# Patient Record
Sex: Female | Born: 1945 | Race: Black or African American | Hispanic: No | Marital: Married | Smoking: Current every day smoker
Health system: Southern US, Community
[De-identification: ages and names within clinical notes are randomized; demographics above are authoritative.]

## PROBLEM LIST (undated history)

## (undated) DIAGNOSIS — R7303 Prediabetes: Secondary | ICD-10-CM

## (undated) DIAGNOSIS — I10 Essential (primary) hypertension: Secondary | ICD-10-CM

## (undated) DIAGNOSIS — E119 Type 2 diabetes mellitus without complications: Secondary | ICD-10-CM

## (undated) DIAGNOSIS — K219 Gastro-esophageal reflux disease without esophagitis: Secondary | ICD-10-CM

## (undated) HISTORY — PX: BACK SURGERY: SHX140

## (undated) HISTORY — PX: TUBAL LIGATION: SHX77

---

## 2006-01-07 ENCOUNTER — Ambulatory Visit: Payer: Self-pay | Admitting: Nurse Practitioner

## 2006-02-21 ENCOUNTER — Ambulatory Visit: Payer: Self-pay | Admitting: Nurse Practitioner

## 2006-08-29 ENCOUNTER — Ambulatory Visit (HOSPITAL_COMMUNITY): Admission: RE | Admit: 2006-08-29 | Discharge: 2006-08-29 | Payer: Self-pay | Admitting: Neurosurgery

## 2006-08-29 ENCOUNTER — Ambulatory Visit: Payer: Self-pay | Admitting: Cardiology

## 2006-09-02 ENCOUNTER — Encounter (HOSPITAL_COMMUNITY): Admission: RE | Admit: 2006-09-02 | Discharge: 2006-09-16 | Payer: Self-pay | Admitting: Cardiology

## 2006-09-02 ENCOUNTER — Ambulatory Visit: Payer: Self-pay | Admitting: Cardiology

## 2006-09-02 ENCOUNTER — Ambulatory Visit (HOSPITAL_COMMUNITY): Admission: RE | Admit: 2006-09-02 | Discharge: 2006-09-02 | Payer: Self-pay | Admitting: Cardiology

## 2006-09-04 ENCOUNTER — Ambulatory Visit: Payer: Self-pay | Admitting: Cardiology

## 2006-09-23 ENCOUNTER — Ambulatory Visit: Payer: Self-pay | Admitting: Cardiology

## 2006-09-30 ENCOUNTER — Ambulatory Visit (HOSPITAL_COMMUNITY): Admission: RE | Admit: 2006-09-30 | Discharge: 2006-09-30 | Payer: Self-pay | Admitting: Neurosurgery

## 2006-10-24 ENCOUNTER — Ambulatory Visit (HOSPITAL_COMMUNITY): Admission: RE | Admit: 2006-10-24 | Discharge: 2006-10-25 | Payer: Self-pay | Admitting: Neurosurgery

## 2007-07-16 ENCOUNTER — Ambulatory Visit: Payer: Self-pay | Admitting: Family Medicine

## 2010-05-01 ENCOUNTER — Ambulatory Visit: Payer: Self-pay | Admitting: Gastroenterology

## 2015-07-11 ENCOUNTER — Ambulatory Visit
Admission: RE | Admit: 2015-07-11 | Discharge: 2015-07-11 | Disposition: A | Discharge status: Home / Self Care | Payer: Medicare Other | Source: Ambulatory Visit | Attending: Family Medicine | Admitting: Family Medicine

## 2015-07-11 ENCOUNTER — Other Ambulatory Visit: Payer: Self-pay | Admitting: Family Medicine

## 2015-07-11 DIAGNOSIS — M25562 Pain in left knee: Secondary | ICD-10-CM

## 2015-12-19 ENCOUNTER — Encounter
Admission: RE | Admit: 2015-12-19 | Discharge: 2015-12-19 | Disposition: A | Discharge status: Home / Self Care | Payer: Medicare Other | Source: Ambulatory Visit | Attending: Obstetrics and Gynecology | Admitting: Obstetrics and Gynecology

## 2015-12-19 DIAGNOSIS — I10 Essential (primary) hypertension: Secondary | ICD-10-CM | POA: Insufficient documentation

## 2015-12-19 DIAGNOSIS — Z01810 Encounter for preprocedural cardiovascular examination: Secondary | ICD-10-CM | POA: Insufficient documentation

## 2015-12-19 DIAGNOSIS — Z01812 Encounter for preprocedural laboratory examination: Secondary | ICD-10-CM | POA: Insufficient documentation

## 2015-12-19 HISTORY — DX: Essential (primary) hypertension: I10

## 2015-12-19 HISTORY — DX: Type 2 diabetes mellitus without complications: E11.9

## 2015-12-19 LAB — TYPE AND SCREEN
ABO/RH(D): O POS
Antibody Screen: NEGATIVE

## 2015-12-19 LAB — COMPREHENSIVE METABOLIC PANEL
ALBUMIN: 4.5 g/dL (ref 3.5–5.0)
ALK PHOS: 61 U/L (ref 38–126)
ALT: 11 U/L — AB (ref 14–54)
ANION GAP: 10 (ref 5–15)
AST: 17 U/L (ref 15–41)
BUN: 22 mg/dL — ABNORMAL HIGH (ref 6–20)
CALCIUM: 9.9 mg/dL (ref 8.9–10.3)
CO2: 24 mmol/L (ref 22–32)
CREATININE: 1.04 mg/dL — AB (ref 0.44–1.00)
Chloride: 98 mmol/L — ABNORMAL LOW (ref 101–111)
GFR calc Af Amer: >60 mL/min (ref >60)
GFR calc non Af Amer: 54 mL/min — ABNORMAL LOW (ref >60)
GLUCOSE: 146 mg/dL — AB (ref 65–99)
Potassium: 3.4 mmol/L — ABNORMAL LOW (ref 3.5–5.1)
SODIUM: 132 mmol/L — AB (ref 135–145)
Total Bilirubin: 0.7 mg/dL (ref 0.3–1.2)
Total Protein: 7.7 g/dL (ref 6.5–8.1)

## 2015-12-19 LAB — ABO/RH: ABO/RH(D): O POS

## 2015-12-19 LAB — CBC
HEMATOCRIT: 39 % (ref 35.0–47.0)
HEMOGLOBIN: 13.4 g/dL (ref 12.0–16.0)
MCH: 28.9 pg (ref 26.0–34.0)
MCHC: 34.4 g/dL (ref 32.0–36.0)
MCV: 84.2 fL (ref 80.0–100.0)
Platelets: 305 10*3/uL (ref 150–440)
RBC: 4.63 MIL/uL (ref 3.80–5.20)
RDW: 12.8 % (ref 11.5–14.5)
WBC: 8 10*3/uL (ref 3.6–11.0)

## 2015-12-19 NOTE — Patient Instructions (Signed)
  Your procedure is scheduled on: 12/27/15 Report to Day Surgery.[facility]OND [facility]LOOR To find out your arrival time please call [phone] between 1PM - 3PM on 12/26/15.  Remember: Instructions that are not followed completely may result in serious medical risk, up to and including death, or upon the discretion of your surgeon and anesthesiologist your surgery may need to be rescheduled.    _X___ 1. Do not eat food or drink liquids after midnight. No gum chewing or hard candies.     __X__ 2. No Alcohol for 24 hours before or after surgery.   ____ 3. Bring all medications with you on the day of surgery if instructed.    __X__ 4. Notify your doctor if there is any change in your medical condition     (cold, fever, infections).     Do not wear jewelry, make-up, hairpins, clips or nail polish.  Do not wear lotions, powders, or perfumes. You may wear deodorant.  Do not shave 48 hours prior to surgery. Men may shave face and neck.  Do not bring valuables to the hospital.    [facility] is [facility] any belongings or valuables.               Contacts, dentures or bridgework may not be worn into surgery.  Leave your suitcase in the car. After surgery it may be brought to your room.  For patients admitted to the hospital, discharge time is determined by your                treatment team.   Patients discharged the day of surgery will not be allowed to drive home.   Please read over the following fact sheets that you were given:   Surgical Site Infection Prevention   ____ Take these medicines the morning of surgery with A SIP OF WATER:    1. AMLODIPINE  2. TRILIPIX  3. LOVASTATIN  4.  5.  6.  ____ Fleet Enema (as directed)   ____ Use CHG Soap as directed  ____ Use inhalers on the day of surgery  ____ Stop metformin 2 days prior to surgery    ____ Take 1/2 of usual insulin dose the night before surgery and none on the morning of surgery.   _X Stop  Coumadin/Plavix/aspirin on  STOP ASPIRIN NOW __X__ Stop Anti-inflammatories on    STOP IBUPROFEN   ____ Stop supplements until after surgery.    ____ Bring C-Pap to the hospital.

## 2015-12-27 ENCOUNTER — Ambulatory Visit
Admission: RE | Admit: 2015-12-27 | Discharge: 2015-12-27 | Disposition: A | Discharge status: Home / Self Care | Payer: Medicare Other | Source: Ambulatory Visit | Attending: Obstetrics and Gynecology | Admitting: Obstetrics and Gynecology

## 2015-12-27 ENCOUNTER — Ambulatory Visit: Payer: Medicare Other | Admitting: Anesthesiology

## 2015-12-27 ENCOUNTER — Encounter: Payer: Self-pay | Admitting: *Deleted

## 2015-12-27 ENCOUNTER — Encounter
Admission: RE | Disposition: A | Discharge status: Home / Self Care | Payer: Self-pay | Source: Ambulatory Visit | Attending: Obstetrics and Gynecology

## 2015-12-27 DIAGNOSIS — E7800 Pure hypercholesterolemia, unspecified: Secondary | ICD-10-CM | POA: Diagnosis not present

## 2015-12-27 DIAGNOSIS — Z888 Allergy status to other drugs, medicaments and biological substances status: Secondary | ICD-10-CM | POA: Insufficient documentation

## 2015-12-27 DIAGNOSIS — N941 Unspecified dyspareunia: Secondary | ICD-10-CM | POA: Diagnosis not present

## 2015-12-27 DIAGNOSIS — N840 Polyp of corpus uteri: Secondary | ICD-10-CM | POA: Insufficient documentation

## 2015-12-27 DIAGNOSIS — I10 Essential (primary) hypertension: Secondary | ICD-10-CM | POA: Insufficient documentation

## 2015-12-27 DIAGNOSIS — F17200 Nicotine dependence, unspecified, uncomplicated: Secondary | ICD-10-CM | POA: Insufficient documentation

## 2015-12-27 DIAGNOSIS — R7303 Prediabetes: Secondary | ICD-10-CM | POA: Insufficient documentation

## 2015-12-27 DIAGNOSIS — Z7982 Long term (current) use of aspirin: Secondary | ICD-10-CM | POA: Insufficient documentation

## 2015-12-27 DIAGNOSIS — Z79899 Other long term (current) drug therapy: Secondary | ICD-10-CM | POA: Diagnosis not present

## 2015-12-27 HISTORY — PX: DILATATION & CURETTAGE/HYSTEROSCOPY WITH MYOSURE: SHX6511

## 2015-12-27 LAB — GLUCOSE, CAPILLARY
GLUCOSE-CAPILLARY: 109 mg/dL — AB (ref 65–99)
Glucose-Capillary: 164 mg/dL — ABNORMAL HIGH (ref 65–99)

## 2015-12-27 SURGERY — DILATATION & CURETTAGE/HYSTEROSCOPY WITH MYOSURE
Anesthesia: General | Wound class: Clean Contaminated

## 2015-12-27 MED ORDER — HYDROCODONE-ACETAMINOPHEN 5-325 MG PO TABS: 1.0000 | ORAL_TABLET | Freq: Four times a day (QID) | ORAL | Status: DC | PRN

## 2015-12-27 MED ORDER — SILVER NITRATE-POT NITRATE 75-25 % EX MISC
CUTANEOUS | Status: AC
  Filled 2015-12-27: qty 2

## 2015-12-27 MED ORDER — FENTANYL CITRATE (PF) 100 MCG/2ML IJ SOLN: 25.0000 ug | INTRAMUSCULAR | Status: DC | PRN

## 2015-12-27 MED ORDER — ONDANSETRON HCL 4 MG/2ML IJ SOLN
INTRAMUSCULAR | Status: DC | PRN
  Administered 2015-12-27: 4 mg via INTRAVENOUS

## 2015-12-27 MED ORDER — LIDOCAINE HCL (CARDIAC) 20 MG/ML IV SOLN
INTRAVENOUS | Status: DC | PRN
  Administered 2015-12-27: 100 mg via INTRAVENOUS

## 2015-12-27 MED ORDER — FAMOTIDINE 20 MG PO TABS
ORAL_TABLET | ORAL | Status: AC
  Administered 2015-12-27: 20 mg via ORAL
  Filled 2015-12-27: qty 1

## 2015-12-27 MED ORDER — LACTATED RINGERS IV SOLN
INTRAVENOUS | Status: DC
  Administered 2015-12-27: 14:00:00 via INTRAVENOUS

## 2015-12-27 MED ORDER — IBUPROFEN 600 MG PO TABS: 600.0000 mg | ORAL_TABLET | Freq: Four times a day (QID) | ORAL | Status: DC | PRN

## 2015-12-27 MED ORDER — PROPOFOL 10 MG/ML IV BOLUS
INTRAVENOUS | Status: DC | PRN
  Administered 2015-12-27: 130 mg via INTRAVENOUS

## 2015-12-27 MED ORDER — FENTANYL CITRATE (PF) 100 MCG/2ML IJ SOLN
INTRAMUSCULAR | Status: DC | PRN
  Administered 2015-12-27: 100 ug via INTRAVENOUS

## 2015-12-27 MED ORDER — SODIUM CHLORIDE 0.9 % IV SOLN
INTRAVENOUS | Status: DC
  Administered 2015-12-27: 12:00:00 via INTRAVENOUS

## 2015-12-27 MED ORDER — ONDANSETRON HCL 4 MG/2ML IJ SOLN: 4.0000 mg | Freq: Once | INTRAMUSCULAR | Status: DC | PRN

## 2015-12-27 MED ORDER — FAMOTIDINE 20 MG PO TABS
20.0000 mg | ORAL_TABLET | Freq: Once | ORAL | Status: AC
  Administered 2015-12-27: 20 mg via ORAL

## 2015-12-27 SURGICAL SUPPLY — 20 items
ABLATOR ENDOMETRIAL MYOSURE (ABLATOR) ×1 IMPLANT
CANISTER SUC SOCK COL 7IN (MISCELLANEOUS) ×3 IMPLANT
CATH ROBINSON RED A/P 16FR (CATHETERS) ×3 IMPLANT
ELECT REM PT RETURN 9FT ADLT (ELECTROSURGICAL) ×3
ELECTRODE REM PT RTRN 9FT ADLT (ELECTROSURGICAL) ×1 IMPLANT
GLOVE BIO SURGEON STRL SZ7 (GLOVE) ×12 IMPLANT
GLOVE BIOGEL PI IND STRL 7.5 (GLOVE) ×4 IMPLANT
GLOVE BIOGEL PI INDICATOR 7.5 (GLOVE) ×8
GOWN STRL REUS W/ TWL LRG LVL3 (GOWN DISPOSABLE) ×1 IMPLANT
GOWN STRL REUS W/TWL LRG LVL3 (GOWN DISPOSABLE) ×3
IV LACTATED RINGER IRRG 3000ML (IV SOLUTION) ×3
IV LR IRRIG 3000ML ARTHROMATIC (IV SOLUTION) ×1 IMPLANT
KIT RM TURNOVER CYSTO AR (KITS) ×3 IMPLANT
MYOSURE LITE POLYP REMOVAL (MISCELLANEOUS) IMPLANT
PACK DNC HYST (MISCELLANEOUS) ×3 IMPLANT
PAD OB MATERNITY 4.3X12.25 (PERSONAL CARE ITEMS) ×3 IMPLANT
PAD PREP 24X41 OB/GYN DISP (PERSONAL CARE ITEMS) ×3 IMPLANT
TUBING CONNECTING 10 (TUBING) ×2 IMPLANT
TUBING CONNECTING 10' (TUBING) ×1
TUBING HYSTEROSCOPY DOLPHIN (MISCELLANEOUS) ×3 IMPLANT

## 2015-12-27 NOTE — Transfer of Care (Signed)
Immediate Anesthesia Transfer of Care Note  Patient: Gail Carroll  Procedure(s) Performed: Procedure(s): DILATATION & CURETTAGE/HYSTEROSCOPY WITH MYOSURE/POLYPECTOMY (N/A)  Patient Location: PACU  Anesthesia Type:General  Level of Consciousness: awake, alert  and oriented  Airway & Oxygen Therapy: Patient Spontanous Breathing  Post-op Assessment: Report given to RN and Post -op Vital signs reviewed and stable  Post vital signs: Reviewed and stable  Last Vitals:  Filed Vitals:   12/27/15 1114  BP: 120/73  Pulse: 86  Temp: 36.7 C  Resp: 16    Complications: No apparent anesthesia complications

## 2015-12-27 NOTE — Anesthesia Procedure Notes (Signed)
Procedure Name: LMA Insertion Date/Time: 12/27/2015 2:16 PM Performed by: Almeta MonasFLETCHER, Birdena Kingma Pre-anesthesia Checklist: Patient identified, Emergency Drugs available, Suction available and Patient being monitored Patient Re-evaluated:Patient Re-evaluated prior to inductionOxygen Delivery Method: Circle system utilized Preoxygenation: Pre-oxygenation with 100% oxygen Intubation Type: IV induction LMA: LMA inserted LMA Size: 3.5 Number of attempts: 1 Tube secured with: Tape Dental Injury: Teeth and Oropharynx as per pre-operative assessment

## 2015-12-27 NOTE — Discharge Instructions (Signed)
AMBULATORY SURGERY  °DISCHARGE INSTRUCTIONS ° ° °1) The drugs that you were given will stay in your system until tomorrow so for the next 24 hours you should not: ° °A) Drive an automobile °B) Make any legal decisions °C) Drink any alcoholic beverage ° ° °2) You may resume regular meals tomorrow.  Today it is better to start with liquids and gradually work up to solid foods. ° °You may eat anything you prefer, but it is better to start with liquids, then soup and crackers, and gradually work up to solid foods. ° ° °3) Please notify your doctor immediately if you have any unusual bleeding, trouble breathing, redness and pain at the surgery site, drainage, fever, or pain not relieved by medication. ° ° ° °4) Additional Instructions: ° ° ° ° ° ° ° °Please contact your physician with any problems or Same Day Surgery at [phone], Monday through Friday 6 am to 4 pm, or [place] [facility] [place] Main number at [phone]. °

## 2015-12-27 NOTE — Anesthesia Preprocedure Evaluation (Addendum)
Anesthesia Evaluation  Patient identified by MRN, date of birth, ID band Patient awake    Reviewed: Allergy & Precautions, NPO status , Patient's Chart, lab work & pertinent test results, reviewed documented beta blocker date and time   Airway Mallampati: II  TM Distance: >3 FB     Dental  (+) Upper Dentures, Lower Dentures   Pulmonary Current Smoker,           Cardiovascular hypertension, Pt. on medications      Neuro/Psych    GI/Hepatic   Endo/Other  diabetes, Type 2  Renal/GU      Musculoskeletal   Abdominal   Peds  Hematology   Anesthesia Other Findings EKG OK.  Reproductive/Obstetrics                            Anesthesia Physical Anesthesia Plan  ASA: II  Anesthesia Plan: General   Post-op Pain Management:    Induction: Intravenous  Airway Management Planned: LMA  Additional Equipment:   Intra-op Plan:   Post-operative Plan:   Informed Consent: I have reviewed the patients History and Physical, chart, labs and discussed the procedure including the risks, benefits and alternatives for the proposed anesthesia with the patient or authorized representative who has indicated his/her understanding and acceptance.     Plan Discussed with: CRNA  Anesthesia Plan Comments:         Anesthesia Quick Evaluation

## 2015-12-27 NOTE — Anesthesia Postprocedure Evaluation (Signed)
Anesthesia Post Note  Patient: Gail Carroll  Procedure(s) Performed: Procedure(s) (LRB): DILATATION & CURETTAGE/HYSTEROSCOPY WITH MYOSURE/POLYPECTOMY (N/A)  Patient location during evaluation: PACU Anesthesia Type: General Level of consciousness: awake and alert Pain management: pain level controlled Vital Signs Assessment: post-procedure vital signs reviewed and stable Respiratory status: spontaneous breathing, nonlabored ventilation, respiratory function stable and patient connected to nasal cannula oxygen Cardiovascular status: blood pressure returned to baseline and stable Postop Assessment: no backache Anesthetic complications: no    Last Vitals:  Filed Vitals:   12/27/15 1536 12/27/15 1542  BP: 129/76 140/71  Pulse: 75 77  Temp:  36.3 C  Resp: 13 11    Last Pain:  Filed Vitals:   12/27/15 1544  PainSc: 2                  Roslynn AmbleElena O Montavious Wierzba

## 2015-12-27 NOTE — Op Note (Signed)
Operative Note   12/27/2015  PRE-OP DIAGNOSIS: endometrial polyp   POST-OP DIAGNOSIS: endometrial polyp   SURGEON: Surgeon(s) and Role:    * Conard NovakStephen D Janeen Watson, MD - Primary  PROCEDURE: Procedure(s): DILATATION & CURETTAGE/HYSTEROSCOPY WITH MYOSURE/POLYPECTOMY   ANESTHESIA: General LMA  ESTIMATED BLOOD LOSS: 10 mL  DRAINS: None   TOTAL IV FLUIDS: 1,000 mL crystalloid  SPECIMENS:  endometrial polyp and endometrial curettings  VTE PROPHYLAXIS: SCDs to the bilateral lower extremities  ANTIBIOTICS: None indicated  FLUID DEFICIT: 30 mL  COMPLICATIONS: None  DISPOSITION: PACU - hemodynamically stable.  CONDITION: stable  FINDINGS: Exam under anesthesia revealed small, mobile 6 week uterus with no masses and bilateral adnexa without masses or fullness. Hysteroscopy revealed a uterine cavity a broad-based posterior wall polypoid lesion and a small (<1cm) left anterior lesion and bilateral tubal ostia and normal appearing endocervical canal.  PROCEDURE IN DETAIL:  After informed consent was obtained, the patient was taken to the operating room where anesthesia was obtained without difficulty. The patient was positioned in the dorsal lithotomy position in candy cane stirrups.  The patient's bladder was catheterized with an in and out foley catheter.  The patient was examined under anesthesia, with the above noted findings.  The bi-valved speculum was placed inside the patient's vagina, and the the anterior lip of the cervix was seen and grasped with the tenaculum.  The cervix was progressively dilated to 6mm Hegar dilator.  The hysteroscope was introduced, with the above noted findings. The myosure light hysteroscope was introduced and the above-noted polyps were removed without difficulty.    The hystersocope was removed and the uterine cavity was curetted yielding scant endometrial curettings.  The hysteroscope was introduced again and excellent hemostasis was noted even with fluid  pressure well below the MAP, and all instruments were removed, with excellent hemostasis noted throughout.  She was then taken out of dorsal lithotomy.  The patient tolerated the procedure well.  Sponge, lap and needle counts were correct x2.  The patient was taken to recovery room in excellent condition.  Conard NovakStephen D. Dick Hark, MD, FACOG 12/27/2015 2:53 PM

## 2015-12-27 NOTE — H&P (Signed)
History and Physical Interval Note:  Gail Carroll  has presented today for surgery, with the diagnosis of ENDOMETRIAL POLYPS  The various methods of treatment have been discussed with the patient and family. After consideration of risks, benefits and other options for treatment, the patient has consented to  Procedure(s): DILATATION & CURETTAGE/HYSTEROSCOPY WITH MYOSURE (N/A) as a surgical intervention .  The patient's history has been reviewed, patient examined, no change in status, stable for surgery.  I have reviewed the patient's chart and labs.  Questions were answered to the patient's satisfaction.    Conard NovakJackson, Keiaira Donlan D, MD 12/27/2015 1:51 PM

## 2015-12-28 ENCOUNTER — Encounter: Payer: Self-pay | Admitting: Obstetrics and Gynecology

## 2015-12-29 LAB — SURGICAL PATHOLOGY

## 2018-05-01 ENCOUNTER — Ambulatory Visit
Admission: RE | Admit: 2018-05-01 | Discharge: 2018-05-01 | Disposition: A | Discharge status: Home / Self Care | Payer: PRIVATE HEALTH INSURANCE | Source: Ambulatory Visit | Attending: Physician Assistant | Admitting: Physician Assistant

## 2018-05-01 ENCOUNTER — Other Ambulatory Visit: Payer: Self-pay | Admitting: Physician Assistant

## 2018-05-01 DIAGNOSIS — R0781 Pleurodynia: Secondary | ICD-10-CM | POA: Diagnosis present

## 2018-05-01 DIAGNOSIS — J9811 Atelectasis: Secondary | ICD-10-CM | POA: Insufficient documentation

## 2018-05-01 DIAGNOSIS — R52 Pain, unspecified: Secondary | ICD-10-CM

## 2019-12-21 IMAGING — CR DG RIBS W/ CHEST 3+V*R*
4 series · 4 of 4 positions shown · non-contrast
Comparison: Chest x-ray 07/16/2007.

CLINICAL DATA: Back injury.

EXAM:
RIGHT RIBS AND CHEST - 3+ VIEW

[chest pa]
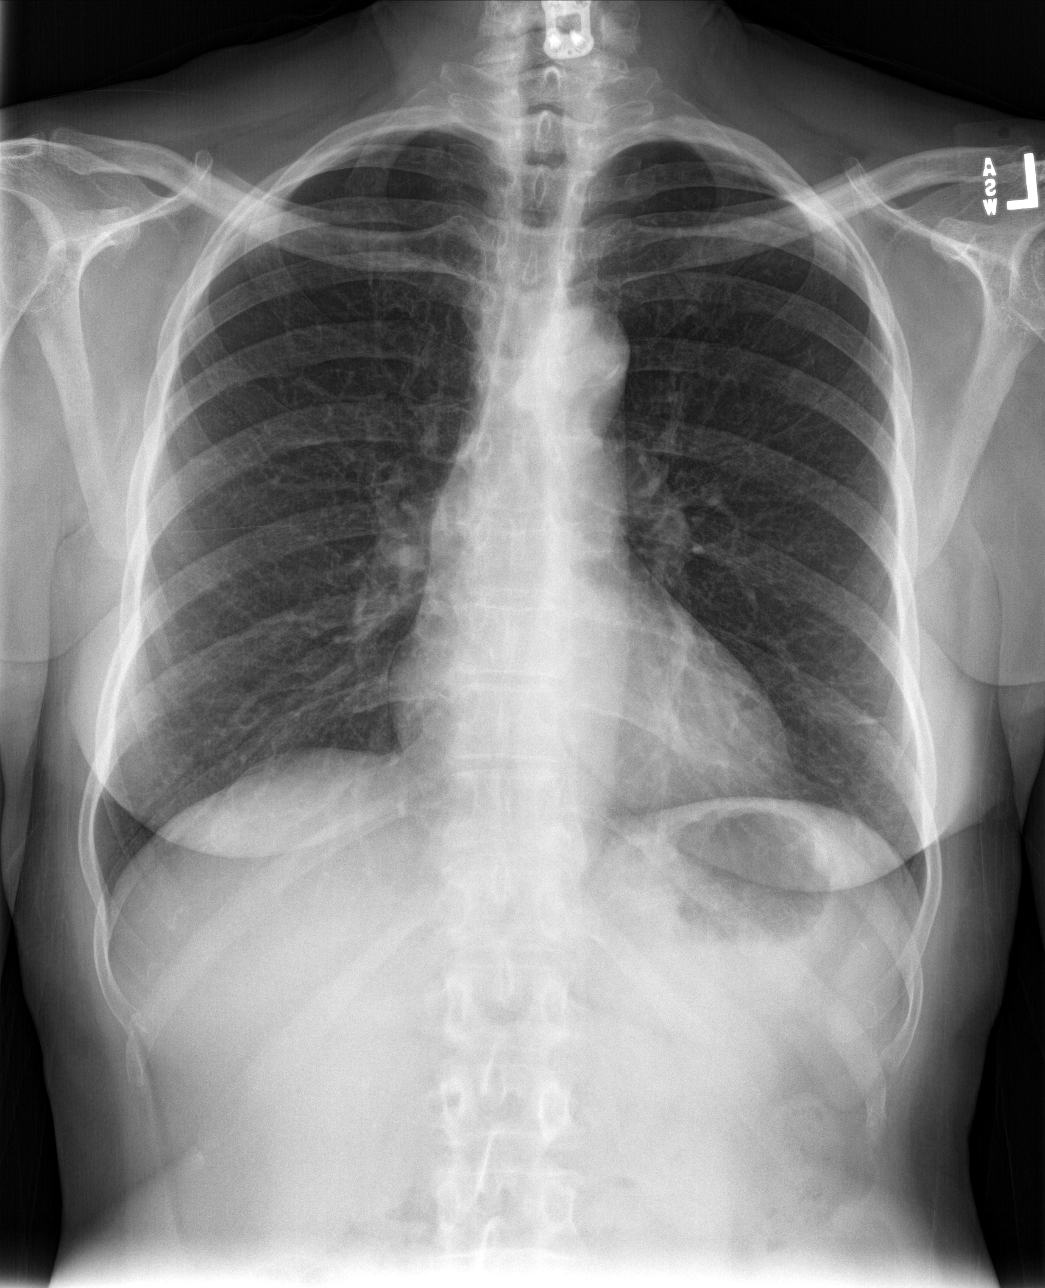

[rib pa]
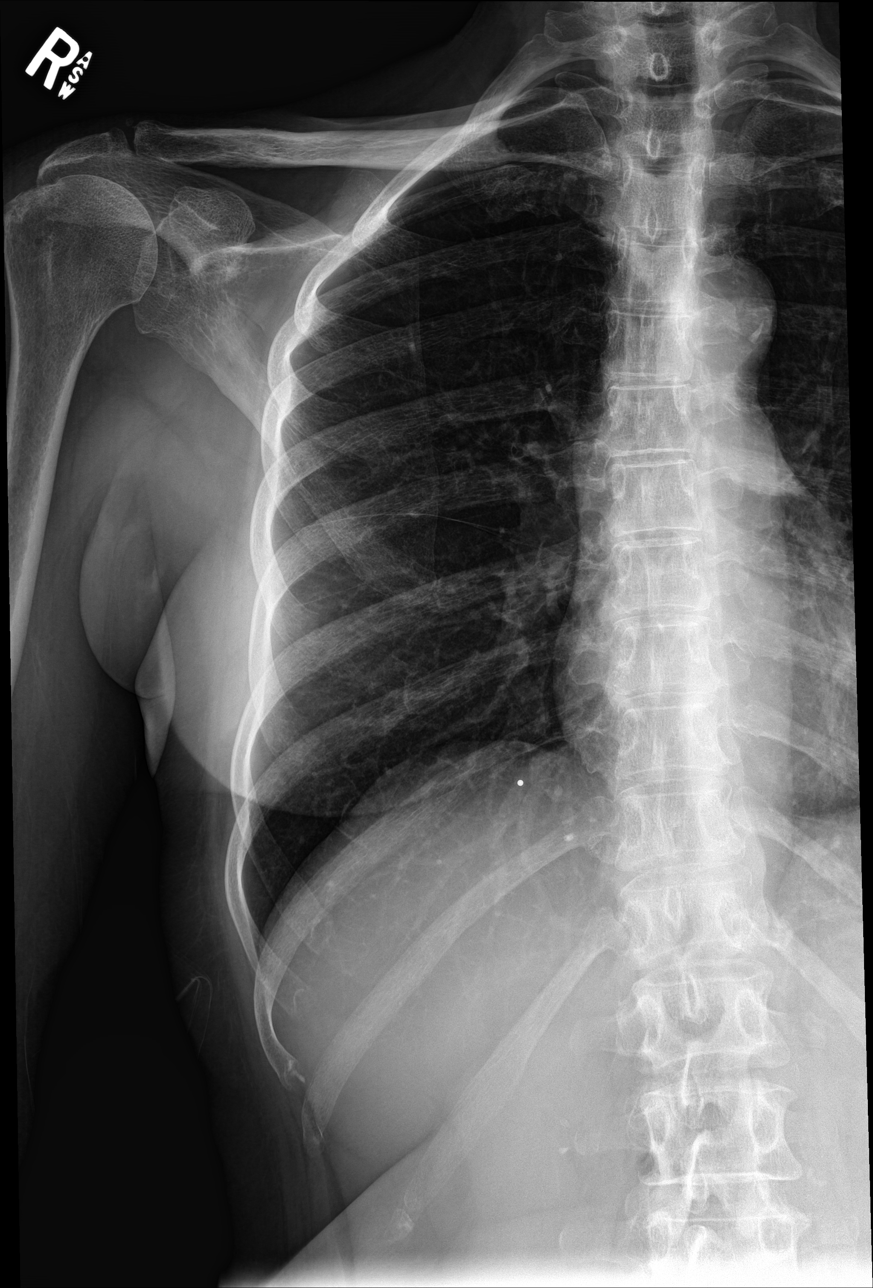

[rib obl (1 of 2)]
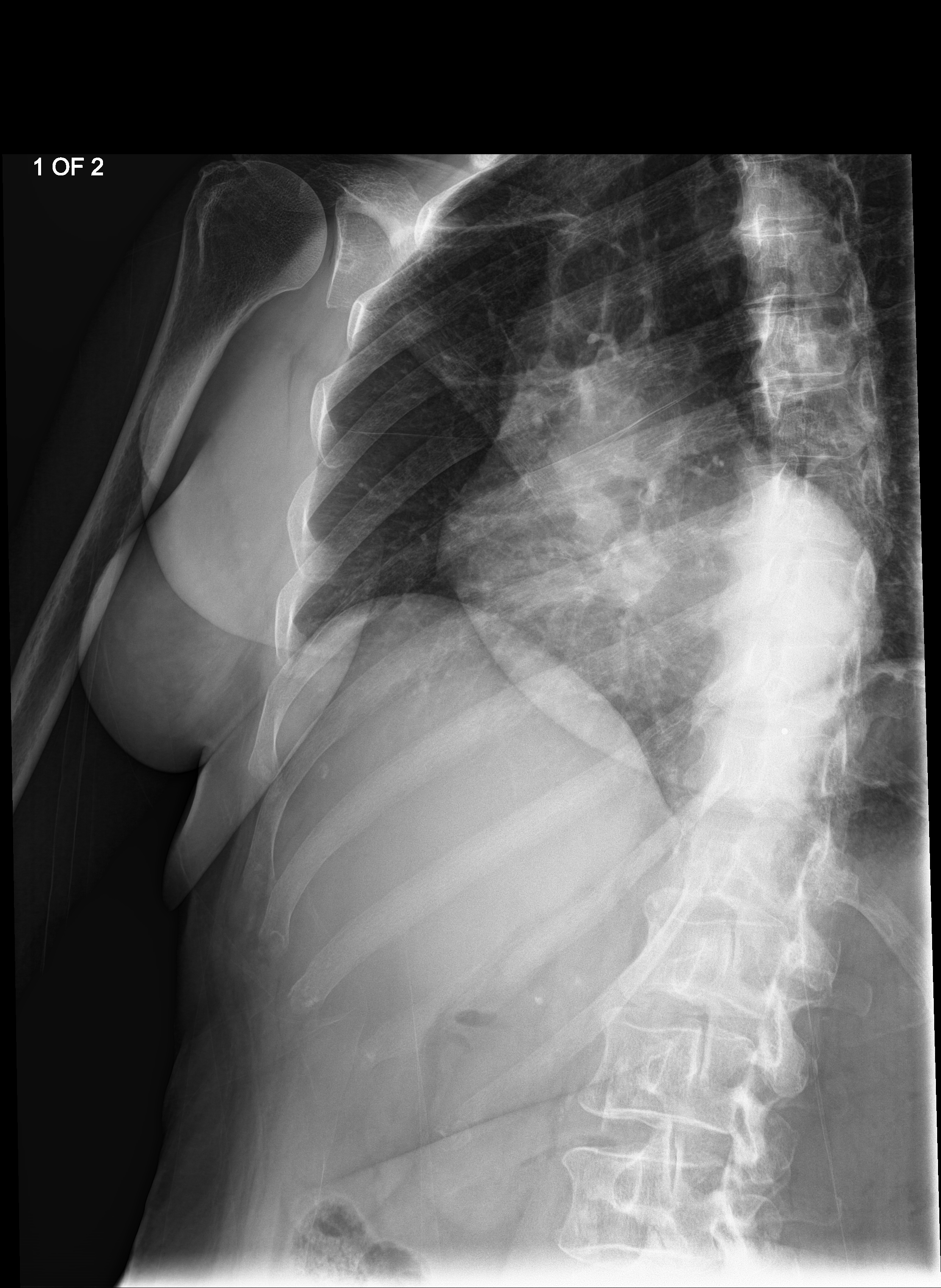

[rib obl (2 of 2)]
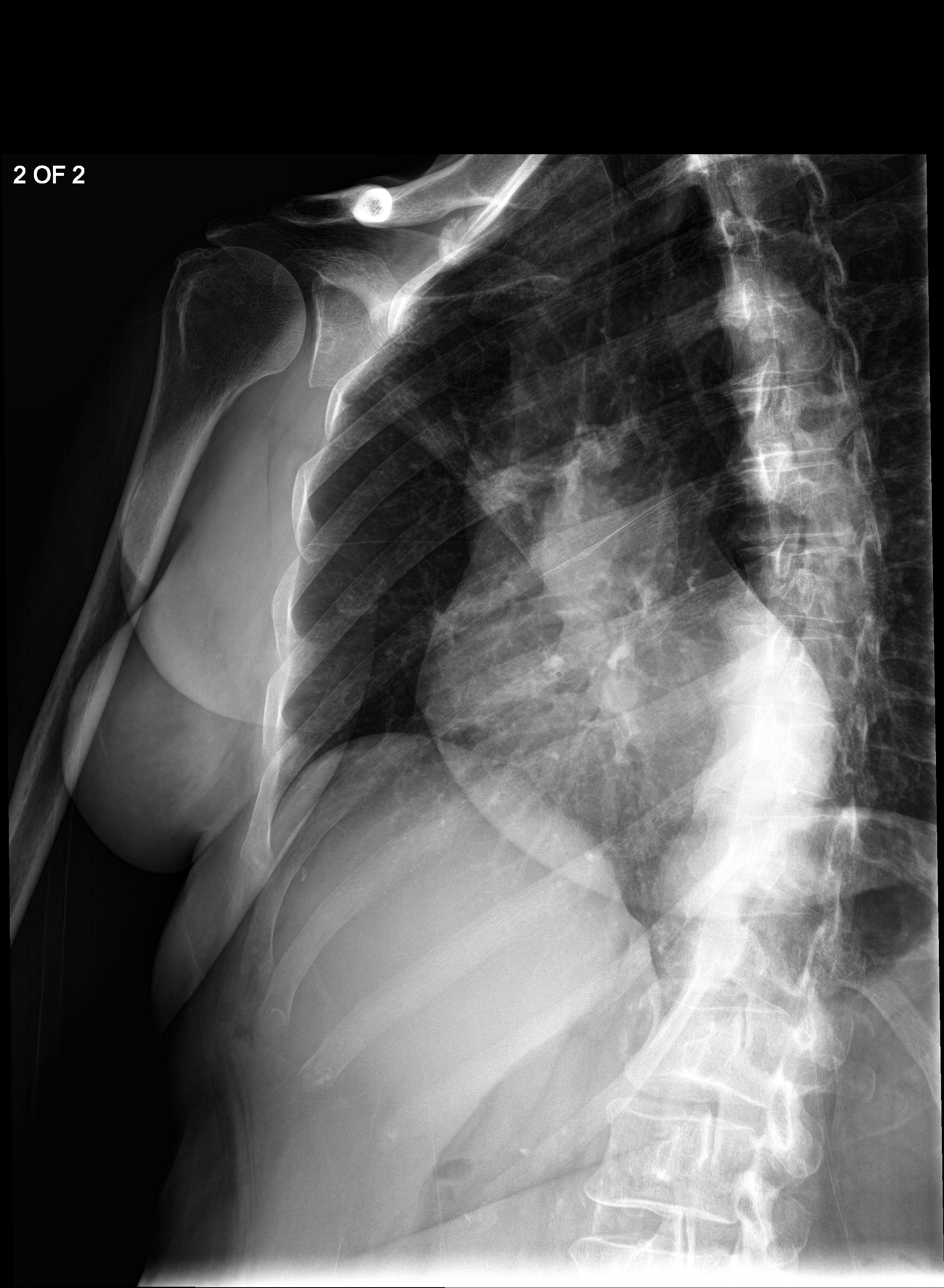

[4 of 4 positions shown; findings below may reference images not displayed]

FINDINGS: Mediastinum and hilar structures normal. Mild left base subsegmental
atelectasis. No focal infiltrate. No pleural effusion or
pneumothorax. Heart size normal. Degenerative change thoracic spine.
Prior cervical spine fusion. No evidence of displaced rib fracture.
IMPRESSION: Number mild left base subsegmental atelectasis.

2. No acute bony abnormality. No evidence of displaced rib fracture
or pneumothorax.

## 2022-03-01 ENCOUNTER — Encounter: Payer: Self-pay | Admitting: Gastroenterology

## 2022-03-02 ENCOUNTER — Encounter: Payer: Self-pay | Admitting: Gastroenterology

## 2022-03-02 ENCOUNTER — Ambulatory Visit: Payer: Medicare HMO | Admitting: Certified Registered"

## 2022-03-02 ENCOUNTER — Ambulatory Visit
Admission: RE | Admit: 2022-03-02 | Discharge: 2022-03-02 | Disposition: A | Discharge status: Home / Self Care | Payer: Medicare HMO | Attending: Gastroenterology | Admitting: Gastroenterology

## 2022-03-02 ENCOUNTER — Encounter
Admission: RE | Disposition: A | Discharge status: Home / Self Care | Payer: Self-pay | Source: Home / Self Care | Attending: Gastroenterology

## 2022-03-02 DIAGNOSIS — E119 Type 2 diabetes mellitus without complications: Secondary | ICD-10-CM | POA: Diagnosis not present

## 2022-03-02 DIAGNOSIS — K6389 Other specified diseases of intestine: Secondary | ICD-10-CM | POA: Diagnosis not present

## 2022-03-02 DIAGNOSIS — I10 Essential (primary) hypertension: Secondary | ICD-10-CM | POA: Insufficient documentation

## 2022-03-02 DIAGNOSIS — K5730 Diverticulosis of large intestine without perforation or abscess without bleeding: Secondary | ICD-10-CM | POA: Diagnosis not present

## 2022-03-02 DIAGNOSIS — F17200 Nicotine dependence, unspecified, uncomplicated: Secondary | ICD-10-CM | POA: Diagnosis not present

## 2022-03-02 DIAGNOSIS — K635 Polyp of colon: Secondary | ICD-10-CM | POA: Diagnosis not present

## 2022-03-02 DIAGNOSIS — K219 Gastro-esophageal reflux disease without esophagitis: Secondary | ICD-10-CM | POA: Insufficient documentation

## 2022-03-02 DIAGNOSIS — Z1211 Encounter for screening for malignant neoplasm of colon: Secondary | ICD-10-CM | POA: Insufficient documentation

## 2022-03-02 HISTORY — DX: Gastro-esophageal reflux disease without esophagitis: K21.9

## 2022-03-02 HISTORY — PX: COLONOSCOPY: SHX5424

## 2022-03-02 HISTORY — DX: Prediabetes: R73.03

## 2022-03-02 SURGERY — COLONOSCOPY
Anesthesia: General

## 2022-03-02 MED ORDER — PROPOFOL 10 MG/ML IV BOLUS
INTRAVENOUS | Status: DC | PRN
  Administered 2022-03-02: 50 mg via INTRAVENOUS

## 2022-03-02 MED ORDER — PROPOFOL 500 MG/50ML IV EMUL
INTRAVENOUS | Status: DC | PRN
  Administered 2022-03-02: 150 ug/kg/min via INTRAVENOUS

## 2022-03-02 MED ORDER — SODIUM CHLORIDE 0.9 % IV SOLN: INTRAVENOUS | Status: DC

## 2022-03-02 MED ORDER — LIDOCAINE HCL (CARDIAC) PF 100 MG/5ML IV SOSY
PREFILLED_SYRINGE | INTRAVENOUS | Status: DC | PRN
  Administered 2022-03-02: 50 mg via INTRAVENOUS

## 2022-03-02 NOTE — Anesthesia Preprocedure Evaluation (Signed)
Anesthesia Evaluation  Patient identified by MRN, date of birth, ID band Patient awake    Reviewed: Allergy & Precautions, NPO status , Patient's Chart, lab work & pertinent test results  History of Anesthesia Complications Negative for: history of anesthetic complications  Airway Mallampati: III  TM Distance: <3 FB Neck ROM: full    Dental  (+) Chipped, Poor Dentition, Upper Dentures   Pulmonary neg shortness of breath, Current Smoker and Patient abstained from smoking.,    Pulmonary exam normal        Cardiovascular Exercise Tolerance: Good hypertension, (-) anginaNormal cardiovascular exam     Neuro/Psych negative neurological ROS  negative psych ROS   GI/Hepatic Neg liver ROS, GERD  Controlled,  Endo/Other  diabetes  Renal/GU negative Renal ROS  negative genitourinary   Musculoskeletal   Abdominal   Peds  Hematology negative hematology ROS (+)   Anesthesia Other Findings Past Medical History: No date: GERD (gastroesophageal reflux disease) No date: Hypertension No date: Pre-diabetes  Past Surgical History: No date: BACK SURGERY     Comment:  c5-6 12/27/2015: DILATATION & CURETTAGE/HYSTEROSCOPY WITH MYOSURE; N/A     Comment:  Procedure: DILATATION & CURETTAGE/HYSTEROSCOPY WITH               MYOSURE/POLYPECTOMY;  Surgeon: Conard Novak, MD;                Location: [facility] ORS;  Service: Gynecology;  Laterality:               N/A; No date: TUBAL LIGATION  BMI    Body Mass Index: 19.73 kg/m      Reproductive/Obstetrics negative OB ROS                             Anesthesia Physical Anesthesia Plan  ASA: 3  Anesthesia Plan: General   Post-op Pain Management:    Induction: Intravenous  PONV Risk Score and Plan: Propofol infusion and TIVA  Airway Management Planned: Natural Airway and Nasal Cannula  Additional Equipment:   Intra-op Plan:   Post-operative Plan:    Informed Consent: I have reviewed the patients History and Physical, chart, labs and discussed the procedure including the risks, benefits and alternatives for the proposed anesthesia with the patient or authorized representative who has indicated his/her understanding and acceptance.     Dental Advisory Given  Plan Discussed with: Anesthesiologist, CRNA and Surgeon  Anesthesia Plan Comments: (Patient consented for risks of anesthesia including but not limited to:  - adverse reactions to medications - risk of airway placement if required - damage to eyes, teeth, lips or other oral mucosa - nerve damage due to positioning  - sore throat or hoarseness - Damage to heart, brain, nerves, lungs, other parts of body or loss of life  Patient voiced understanding.)        Anesthesia Quick Evaluation

## 2022-03-02 NOTE — Anesthesia Postprocedure Evaluation (Signed)
Anesthesia Post Note  Patient: Gail Carroll  Procedure(s) Performed: COLONOSCOPY  Patient location during evaluation: Endoscopy Anesthesia Type: General Level of consciousness: awake and alert Pain management: pain level controlled Vital Signs Assessment: post-procedure vital signs reviewed and stable Respiratory status: spontaneous breathing, nonlabored ventilation, respiratory function stable and patient connected to nasal cannula oxygen Cardiovascular status: blood pressure returned to baseline and stable Postop Assessment: no apparent nausea or vomiting Anesthetic complications: no   No notable events documented.   Last Vitals:  Vitals:   03/02/22 1126 03/02/22 1136  BP: (!) 121/93 (!) 141/67  Pulse:    Resp:    Temp:    SpO2:      Last Pain:  Vitals:   03/02/22 1136  TempSrc:   PainSc: 0-No pain                 Cleda Mccreedy Kierre Deines

## 2022-03-02 NOTE — Transfer of Care (Signed)
Immediate Anesthesia Transfer of Care Note  Patient: Gail Carroll  Procedure(s) Performed: COLONOSCOPY  Patient Location: PACU and Endoscopy Unit  Anesthesia Type:General  Level of Consciousness: awake  Airway & Oxygen Therapy: Patient Spontanous Breathing  Post-op Assessment: Report given to RN  Post vital signs: Reviewed and stable  Last Vitals:  Vitals Value Taken Time  BP 101/69 03/02/22 1116  Temp 36.4 C 03/02/22 1116  Pulse 72 03/02/22 1116  Resp 22 03/02/22 1116  SpO2 100 % 03/02/22 1116  Vitals shown include unvalidated device data.  Last Pain:  Vitals:   03/02/22 1116  TempSrc: Temporal  PainSc: 0-No pain         Complications: No notable events documented.

## 2022-03-02 NOTE — Anesthesia Procedure Notes (Signed)
Procedure Name: MAC Date/Time: 03/02/2022 10:40 AM  Performed by: Biagio Borg, CRNAPre-anesthesia Checklist: Patient identified, Emergency Drugs available, Suction available, Patient being monitored and Timeout performed Patient Re-evaluated:Patient Re-evaluated prior to induction Oxygen Delivery Method: Nasal cannula Induction Type: IV induction Placement Confirmation: positive ETCO2 and CO2 detector

## 2022-03-02 NOTE — Interval H&P Note (Signed)
History and Physical Interval Note: Preprocedure H&P from 03/02/22  was reviewed and there was no interval change after seeing and examining the patient.  Written consent was obtained from the patient after discussion of risks, benefits, and alternatives. Patient has consented to proceed with Colonoscopy with possible intervention   03/02/2022 10:30 AM  Gail Carroll  has presented today for surgery, with the diagnosis of Colon cancer screening (Z12.11).  The various methods of treatment have been discussed with the patient and family. After consideration of risks, benefits and other options for treatment, the patient has consented to  Procedure(s): COLONOSCOPY (N/A) as a surgical intervention.  The patient's history has been reviewed, patient examined, no change in status, stable for surgery.  I have reviewed the patient's chart and labs.  Questions were answered to the patient's satisfaction.     Jaynie Collins

## 2022-03-02 NOTE — Op Note (Signed)
[facility]astroenterology Patient Name: Gail Carroll Procedure Date: 03/02/2022 10:23 AM MRN: [id number] Account #: [id number] Date of Birth: [birth year] Admit Type: Outpatient Age: 76 Room: [facility] ENDO ROOM 1 Gender: Female Note Status: Finalized Instrument Name: Colonscope [id number] Procedure:             Colonoscopy Indications:           Screening for colorectal malignant neoplasm Providers:             Rueben Bash, DO Referring MD:          Marguerita Merles, MD (Referring MD) Medicines:             Monitored Anesthesia Care Complications:         No immediate complications. Estimated blood loss:                         Minimal. Procedure:             Pre-Anesthesia Assessment:                        - Prior to the procedure, a History and Physical was                         performed, and patient medications and allergies were                         reviewed. The patient is competent. The risks and                         benefits of the procedure and the sedation options and                         risks were discussed with the patient. All questions                         were answered and informed consent was obtained.                         Patient identification and proposed procedure were                         verified by the physician, the nurse, the anesthetist                         and the technician in the endoscopy suite. Mental                         Status Examination: alert and oriented. Airway                         Examination: normal oropharyngeal airway and neck                         mobility. Respiratory Examination: clear to                         auscultation. CV Examination: RRR, no murmurs, no S3  or S4. Prophylactic Antibiotics: The patient does not                         require prophylactic antibiotics. Prior                         Anticoagulants: The patient has taken no previous                          anticoagulant or antiplatelet agents. ASA Grade                         Assessment: III - A patient with severe systemic                         disease. After reviewing the risks and benefits, the                         patient was deemed in satisfactory condition to                         undergo the procedure. The anesthesia plan was to use                         monitored anesthesia care (MAC). Immediately prior to                         administration of medications, the patient was                         re-assessed for adequacy to receive sedatives. The                         heart rate, respiratory rate, oxygen saturations,                         blood pressure, adequacy of pulmonary ventilation, and                         response to care were monitored throughout the                         procedure. The physical status of the patient was                         re-assessed after the procedure.                        After obtaining informed consent, the colonoscope was                         passed under direct vision. Throughout the procedure,                         the patient's blood pressure, pulse, and oxygen                         saturations were monitored continuously. The  Colonoscope was introduced through the anus and                         advanced to the the cecum, identified by appendiceal                         orifice and ileocecal valve. The colonoscopy was                         somewhat difficult due to multiple diverticula in the                         colon. Successful completion of the procedure was                         aided by withdrawing the scope and replacing with the                         pediatric colonoscope. The patient tolerated the                         procedure well. The quality of the bowel preparation                         was evaluated using the BBPS [facility] Bowel Preparation                          Scale) with scores of: Right Colon = 3, Transverse                         Colon = 3 and Left Colon = 3 (entire mucosa seen well                         with no residual staining, small fragments of stool or                         opaque liquid). The total BBPS score equals 9. The                         ileocecal valve, appendiceal orifice, and rectum were                         photographed. Findings:      The perianal and digital rectal examinations were normal. Pertinent       negatives include normal sphincter tone.      A 1 to 2 mm polyp was found in the sigmoid colon. The polyp was sessile.       The polyp was removed with a jumbo cold forceps. Resection and retrieval       were complete. Estimated blood loss was minimal.      Multiple small-mouthed diverticula were found in the left colon.       Estimated blood loss: none.      A diffuse area of moderate melanosis was found in the recto-sigmoid       colon, in the sigmoid colon and in the descending colon. Estimated blood       loss: none.  The exam was otherwise without abnormality on direct and retroflexion       views. Impression:            - One 1 to 2 mm polyp in the sigmoid colon, removed                         with a jumbo cold forceps. Resected and retrieved.                        - Diverticulosis in the left colon.                        - Melanosis in the colon.                        - The examination was otherwise normal on direct and                         retroflexion views. Recommendation:        - Discharge patient to home.                        - Resume previous diet.                        - Continue present medications.                        - Await pathology results.                        - Repeat colonoscopy not indicated as patient will be                         beyond screening age at that time.                        - Return to referring physician as previously                          scheduled.                        - The findings and recommendations were discussed with                         the patient. Procedure Code(s):     --- Professional ---                        [phone], Colonoscopy, flexible; with biopsy, single or                         multiple Diagnosis Code(s):     --- Professional ---                        Z12.11, Encounter for screening for malignant neoplasm                         of colon  K63.5, Polyp of colon                        K63.89, Other specified diseases of intestine                        K57.30, Diverticulosis of large intestine without                         perforation or abscess without bleeding CPT copyright 2019 [employer]. All rights reserved. The codes documented in this report are preliminary and upon coder review may  be revised to meet current compliance requirements. Attending Participation:      I personally performed the entire procedure. Volney American, DO Annamaria Helling DO, DO 03/02/2022 11:17:10 AM This report has been signed electronically. Number of Addenda: 0 Note Initiated On: 03/02/2022 10:23 AM Scope Withdrawal Time: 0 hours 17 minutes 25 seconds  Total Procedure Duration: 0 hours 30 minutes 44 seconds  Estimated Blood Loss:  Estimated blood loss was minimal.      [facility]

## 2022-03-02 NOTE — H&P (Signed)
Pre-Procedure H&P   Patient ID: Gail Carroll is a 76 y.o. female.  Gastroenterology Provider: Jaynie Collins, DO  Referring Provider: Tawni Pummel, PA PCP: Leanna Sato, MD  Date: 03/02/2022  HPI Ms. Gail Carroll is a 76 y.o. female who presents today for Colonoscopy for screening colonoscopy. Patient last underwent colonoscopy 10 years ago without polyps.  No family history of colon cancer or colon polyps. Patient with regular bowel movements without melena hematochezia diarrhea or constipation.   Past Medical History:  Diagnosis Date   GERD (gastroesophageal reflux disease)    Hypertension    Pre-diabetes     Past Surgical History:  Procedure Laterality Date   BACK SURGERY     c5-6   DILATATION & CURETTAGE/HYSTEROSCOPY WITH MYOSURE N/A 12/27/2015   Procedure: DILATATION & CURETTAGE/HYSTEROSCOPY WITH MYOSURE/POLYPECTOMY;  Surgeon: Conard Novak, MD;  Location: [facility] ORS;  Service: Gynecology;  Laterality: N/A;   TUBAL LIGATION      Family History No h/o GI disease or malignancy  Review of Systems  Constitutional:  Negative for activity change, appetite change, chills, diaphoresis, fatigue, fever and unexpected weight change.  HENT:  Negative for trouble swallowing and voice change.   Respiratory:  Negative for shortness of breath and wheezing.   Cardiovascular:  Negative for chest pain, palpitations and leg swelling.  Gastrointestinal:  Negative for abdominal distention, abdominal pain, anal bleeding, blood in stool, constipation, diarrhea, nausea, rectal pain and vomiting.  Musculoskeletal:  Negative for arthralgias and myalgias.  Skin:  Negative for color change and pallor.  Neurological:  Negative for dizziness, syncope and weakness.  Psychiatric/Behavioral:  Negative for confusion.   All other systems reviewed and are negative.    Medications No current facility-administered medications on file prior to encounter.   Current Outpatient  Medications on File Prior to Encounter  Medication Sig Dispense Refill   acetaminophen (TYLENOL) 325 MG tablet Take 650 mg by mouth every morning.     amLODipine (NORVASC) 10 MG tablet Take 10 mg by mouth daily.     amLODipine-benazepril (LOTREL) 10-20 MG capsule Take 1 capsule by mouth daily.     aspirin 81 MG tablet Take 81 mg by mouth daily.     chlorpheniramine (CHLOR-TRIMETON) 4 MG tablet Take 12 mg by mouth 2 (two) times daily as needed for allergies.     Choline Fenofibrate (TRILIPIX PO) Take 45 mg by mouth every morning.     Fexofenadine HCl (ALLERGY 24-HR PO) Take 1 tablet by mouth every morning. [employer] brand     ketorolac (ACULAR) 0.5 % ophthalmic solution 1 drop 4 (four) times daily.     losartan-hydrochlorothiazide (HYZAAR) 50-12.5 MG tablet Take 1 tablet by mouth daily.     lovastatin (MEVACOR) 40 MG tablet Take by mouth every morning.     moxifloxacin (VIGAMOX) 0.5 % ophthalmic solution Place 1 drop into both eyes 3 (three) times daily.     omeprazole (PRILOSEC) 40 MG capsule Take 40 mg by mouth daily.     HYDROcodone-acetaminophen (NORCO) 5-325 MG tablet Take 1 tablet by mouth every 6 (six) hours as needed for moderate pain. 20 tablet 0   ibuprofen (ADVIL,MOTRIN) 600 MG tablet Take 1 tablet (600 mg total) by mouth every 6 (six) hours as needed for mild pain or cramping. 30 tablet 0    Pertinent medications related to GI and procedure were reviewed by me with the patient prior to the procedure   Current Facility-Administered Medications:  0.9 %  sodium chloride infusion, , Intravenous, Continuous, Jaynie Collins, DO, Last Rate: 20 mL/hr at 03/02/22 1004, New Bag at 03/02/22 1004      Allergies  Allergen Reactions   Tea Other (See Comments)    Throat swells   Valium [Diazepam] Other (See Comments)    Very sensitive, small dose makes her incoherent   Allergies were reviewed by me prior to the procedure  Objective   Body mass index is 19.73  kg/m. Vitals:   03/02/22 0947  BP: 126/74  Pulse: 81  Resp: 15  Temp: 98.2 F (36.8 C)  TempSrc: Temporal  SpO2: 100%  Weight: 57.2 kg  Height: 5\' 7"  (1.702 m)     Physical Exam Vitals and nursing note reviewed.  Constitutional:      General: She is not in acute distress.    Appearance: Normal appearance. She is not ill-appearing, toxic-appearing or diaphoretic.  HENT:     Head: Normocephalic and atraumatic.     Nose: Nose normal.     Mouth/Throat:     Mouth: Mucous membranes are moist.     Pharynx: Oropharynx is clear.  Eyes:     General: No scleral icterus.    Extraocular Movements: Extraocular movements intact.  Cardiovascular:     Rate and Rhythm: Normal rate and regular rhythm.     Heart sounds: Normal heart sounds. No murmur heard.    No friction rub. No gallop.  Pulmonary:     Effort: Pulmonary effort is normal. No respiratory distress.     Breath sounds: Normal breath sounds. No wheezing, rhonchi or rales.  Abdominal:     General: Bowel sounds are normal. There is no distension.     Palpations: Abdomen is soft.     Tenderness: There is no abdominal tenderness. There is no guarding or rebound.  Genitourinary:    Comments: Patient wanted to show area with inflamed blackheads in her pubic area, one of which she recently popped. Musculoskeletal:     Cervical back: Neck supple.     Right lower leg: No edema.     Left lower leg: No edema.  Skin:    General: Skin is warm and dry.     Coloration: Skin is not jaundiced or pale.  Neurological:     General: No focal deficit present.     Mental Status: She is alert and oriented to person, place, and time. Mental status is at baseline.  Psychiatric:        Mood and Affect: Mood normal.        Behavior: Behavior normal.        Thought Content: Thought content normal.        Judgment: Judgment normal.      Assessment:  Ms. Gail Carroll is a 76 y.o. female  who presents today for Colonoscopy for screening  colonoscopy.  Plan:  Colonoscopy with possible intervention today  Colonoscopy with possible biopsy, control of bleeding, polypectomy, and interventions as necessary has been discussed with the patient/patient representative. Informed consent was obtained from the patient/patient representative after explaining the indication, nature, and risks of the procedure including but not limited to death, bleeding, perforation, missed neoplasm/lesions, cardiorespiratory compromise, and reaction to medications. Opportunity for questions was given and appropriate answers were provided. Patient/patient representative has verbalized understanding is amenable to undergoing the procedure.   68, DO  [facility]  Portions of the record may have been created with voice recognition software. Occasional  wrong-word or 'sound-a-like' substitutions may have occurred due to the inherent limitations of voice recognition software.  Read the chart carefully and recognize, using context, where substitutions may have occurred.

## 2022-03-05 ENCOUNTER — Encounter: Payer: Self-pay | Admitting: Gastroenterology

## 2022-03-05 LAB — SURGICAL PATHOLOGY

## 2022-09-28 ENCOUNTER — Encounter (INDEPENDENT_AMBULATORY_CARE_PROVIDER_SITE_OTHER): Payer: Self-pay | Admitting: Vascular Surgery

## 2022-09-28 ENCOUNTER — Encounter (INDEPENDENT_AMBULATORY_CARE_PROVIDER_SITE_OTHER): Payer: Self-pay

## 2022-10-22 ENCOUNTER — Other Ambulatory Visit (INDEPENDENT_AMBULATORY_CARE_PROVIDER_SITE_OTHER): Payer: Self-pay | Admitting: Nurse Practitioner

## 2022-10-22 DIAGNOSIS — I739 Peripheral vascular disease, unspecified: Secondary | ICD-10-CM

## 2022-10-24 ENCOUNTER — Ambulatory Visit (INDEPENDENT_AMBULATORY_CARE_PROVIDER_SITE_OTHER): Payer: Medicare PPO | Admitting: Nurse Practitioner

## 2022-10-24 ENCOUNTER — Encounter (INDEPENDENT_AMBULATORY_CARE_PROVIDER_SITE_OTHER): Payer: Self-pay | Admitting: Nurse Practitioner

## 2022-10-24 ENCOUNTER — Ambulatory Visit (INDEPENDENT_AMBULATORY_CARE_PROVIDER_SITE_OTHER): Payer: Medicare PPO

## 2022-10-24 VITALS — BP 123/77 | HR 81 | Ht 67.0 in | Wt 126.0 lb

## 2022-10-24 DIAGNOSIS — I739 Peripheral vascular disease, unspecified: Secondary | ICD-10-CM

## 2022-10-24 DIAGNOSIS — I10 Essential (primary) hypertension: Secondary | ICD-10-CM

## 2022-11-11 ENCOUNTER — Encounter (INDEPENDENT_AMBULATORY_CARE_PROVIDER_SITE_OTHER): Payer: Self-pay | Admitting: Nurse Practitioner

## 2022-11-11 NOTE — Progress Notes (Signed)
Subjective:    Patient ID: Gail Carroll, female    DOB: 10/08/1945, 77 y.o.   MRN: [id number] Chief Complaint  Patient presents with   New Patient (Initial Visit)    ABI and consult left leg claduication, Ref by Dr Lennox Grumbles     The patient is seen for evaluation of claudication-like symptoms.  The patient denies rest pain or dangling of an extremity off the side of the bed during the night for relief. No open wounds or sores at this time. No prior interventions or surgeries.  No history of back problems or DJD of the lumbar sacral spine.   The patient's blood pressure has been stable and relatively well controlled. The patient denies amaurosis fugax or recent TIA symptoms. There are no recent neurological changes noted. The patient denies history of DVT, PE or superficial thrombophlebitis. The patient denies recent episodes of angina or shortness of breath.   ABI on the right is 1.18 and 0.90 on the left.  The patient has triphasic tibial artery waveforms on the right with biphasic on the left.  Normal toe waveforms bilaterally.    Review of Systems  All other systems reviewed and are negative.      Objective:   Physical Exam Vitals reviewed.  HENT:     Head: Normocephalic.  Cardiovascular:     Rate and Rhythm: Normal rate.     Pulses: Normal pulses.  Pulmonary:     Effort: Pulmonary effort is normal.  Skin:    General: Skin is warm and dry.  Neurological:     Mental Status: She is alert and oriented to person, place, and time.  Psychiatric:        Mood and Affect: Mood normal.        Behavior: Behavior normal.        Thought Content: Thought content normal.        Judgment: Judgment normal.     BP 123/77   Pulse 81   Ht '5\' 7"'$  (1.702 m)   Wt 126 lb (57.2 kg)   BMI 19.73 kg/m   Past Medical History:  Diagnosis Date   GERD (gastroesophageal reflux disease)    Hypertension    Pre-diabetes     Social History   Socioeconomic History   Marital status:  Married    Spouse name: Not on file   Number of children: Not on file   Years of education: Not on file   Highest education level: Not on file  Occupational History   Not on file  Tobacco Use   Smoking status: Every Day    Types: Cigarettes   Smokeless tobacco: Never  Vaping Use   Vaping Use: Never used  Substance and Sexual Activity   Alcohol use: No   Drug use: Never   Sexual activity: Yes    Birth control/protection: Post-menopausal  Other Topics Concern   Not on file  Social History Narrative   Not on file   Social Determinants of Health   Financial Resource Strain: Not on file  Food Insecurity: Not on file  Transportation Needs: Not on file  Physical Activity: Not on file  Stress: Not on file  Social Connections: Not on file  Intimate Partner Violence: Not on file    Past Surgical History:  Procedure Laterality Date   BACK SURGERY     c5-6   COLONOSCOPY N/A 03/02/2022   Procedure: COLONOSCOPY;  Surgeon: Annamaria Helling, DO;  Location: [place];  Service: Gastroenterology;  Laterality: N/A;   DILATATION & CURETTAGE/HYSTEROSCOPY WITH MYOSURE N/A 12/27/2015   Procedure: DILATATION & CURETTAGE/HYSTEROSCOPY WITH MYOSURE/POLYPECTOMY;  Surgeon: Will Bonnet, MD;  Location: [facility];  Service: Gynecology;  Laterality: N/A;   TUBAL LIGATION      History reviewed. No pertinent family history.  Allergies  Allergen Reactions   Tea Other (See Comments)    Throat swells   Valium [Diazepam] Other (See Comments)    Very sensitive, small dose makes her incoherent       Latest Ref Rng & Units 12/19/2015   10:24 AM  CBC  WBC 3.6 - 11.0 K/uL 8.0   Hemoglobin 12.0 - 16.0 g/dL 13.4   Hematocrit 35.0 - 47.0 % 39.0   Platelets 150 - 440 K/uL 305       CMP     Component Value Date/Time   NA 132 (L) 12/19/2015 1024   K 3.4 (L) 12/19/2015 1024   CL 98 (L) 12/19/2015 1024   CO2 24 12/19/2015 1024   GLUCOSE 146 (H) 12/19/2015 1024   BUN 22 (H)  12/19/2015 1024   CREATININE 1.04 (H) 12/19/2015 1024   CALCIUM 9.9 12/19/2015 1024   PROT 7.7 12/19/2015 1024   ALBUMIN 4.5 12/19/2015 1024   AST 17 12/19/2015 1024   ALT 11 (L) 12/19/2015 1024   ALKPHOS 61 12/19/2015 1024   BILITOT 0.7 12/19/2015 1024   GFRNONAA 54 (L) 12/19/2015 1024   GFRAA >60 12/19/2015 1024     VAS [place] ABI WITH/WO TBI  Result Date: 10/26/2022  LOWER EXTREMITY DOPPLER STUDY Patient Name:  Gail Carroll  Date of Exam:   10/24/2022 Medical Rec #: [id number]       Accession #:    [id number] Date of Birth: [birth year]       Patient Gender: F Patient Age:   67 years Exam Location:  [facility] Procedure:      VAS [place] ABI WITH/WO TBI Referring Phys: Eulogio Ditch --------------------------------------------------------------------------------  Indications: LLE claudication  Performing Technologist: Blondell Reveal RT, RDMS, RVT  Examination Guidelines: A complete evaluation includes at minimum, Doppler waveform signals and systolic blood pressure reading at the level of bilateral brachial, anterior tibial, and posterior tibial arteries, when vessel segments are accessible. Bilateral testing is considered an integral part of a complete examination. Photoelectric Plethysmograph (PPG) waveforms and toe systolic pressure readings are included as required and additional duplex testing as needed. Limited examinations for reoccurring indications may be performed as noted.  ABI Findings: +---------+------------------+-----+---------+--------+ Right    Rt Pressure (mmHg)IndexWaveform Comment  +---------+------------------+-----+---------+--------+ Brachial 128                                      +---------+------------------+-----+---------+--------+ PTA      152               1.18 triphasic         +---------+------------------+-----+---------+--------+ DP       140               1.09 triphasic         +---------+------------------+-----+---------+--------+  Great Toe101               0.78 Normal            +---------+------------------+-----+---------+--------+ +---------+------------------+-----+--------+-------+ Left     Lt Pressure (mmHg)IndexWaveformComment +---------+------------------+-----+--------+-------+ Brachial 129                                    +---------+------------------+-----+--------+-------+  PTA      116               0.90 biphasic        +---------+------------------+-----+--------+-------+ DP       113               0.88 biphasic        +---------+------------------+-----+--------+-------+ Great Toe82                0.64 Normal          +---------+------------------+-----+--------+-------+  Summary: Right: Resting right ankle-brachial index is within normal range. The right toe-brachial index is normal. 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 The left toe-brachial index is mildly abnormal. *See table(s) above for measurements and observations. Electronically signed by Hortencia Pilar MD on 10/26/2022 at 11:05:54 AM.    Final        Assessment & Plan:   1. Claudication [facility]) The patient has minimally decreased ABIs and the claudication symptoms are minimal.  Based on this we will continue with annual follow-up or sooner follow-up if issues arise. - VAS [place] ABI WITH/WO TBI  2. Essential hypertension Continue antihypertensive medications as already ordered, these medications have been reviewed and there are no changes at this time.   Current Outpatient Medications on File Prior to Visit  Medication Sig Dispense Refill   acetaminophen (TYLENOL) 325 MG tablet Take 650 mg by mouth every morning.     amLODipine (NORVASC) 10 MG tablet Take 10 mg by mouth daily.     aspirin 81 MG tablet Take 81 mg by mouth daily.     Choline Fenofibrate (FENOFIBRIC ACID PO) Take 45 mg by mouth daily at 6 (six) AM.     diphenhydramine-acetaminophen (TYLENOL PM)  25-500 MG TABS tablet Take 1 tablet by mouth at bedtime as needed.     hydrochlorothiazide (MICROZIDE) 12.5 MG capsule Take 12.5 mg by mouth daily.     ibuprofen (ADVIL,MOTRIN) 600 MG tablet Take 1 tablet (600 mg total) by mouth every 6 (six) hours as needed for mild pain or cramping. 30 tablet 0   ketorolac (ACULAR) 0.5 % ophthalmic solution 1 drop 4 (four) times daily.     linagliptin (TRADJENTA) 5 MG TABS tablet Take 5 mg by mouth daily.     losartan (COZAAR) 50 MG tablet Take 50 mg by mouth daily.     lovastatin (MEVACOR) 40 MG tablet Take by mouth every morning.     moxifloxacin (VIGAMOX) 0.5 % ophthalmic solution Place 1 drop into both eyes 3 (three) times daily.     omeprazole (PRILOSEC) 40 MG capsule Take 40 mg by mouth daily.     OVER THE COUNTER MEDICATION [employer] cold and allergy     No current facility-administered medications on file prior to visit.    There are no Patient Instructions on file for this visit. No follow-ups on file.   Kris Hartmann, NP

## 2022-11-14 ENCOUNTER — Encounter: Payer: Self-pay | Admitting: Emergency Medicine

## 2022-11-14 ENCOUNTER — Ambulatory Visit
Admission: EM | Admit: 2022-11-14 | Discharge: 2022-11-14 | Disposition: A | Discharge status: Home / Self Care | Payer: Medicare PPO | Source: Home / Self Care | Attending: Family Medicine | Admitting: Family Medicine

## 2022-11-14 ENCOUNTER — Ambulatory Visit: Payer: Medicare PPO

## 2022-11-14 ENCOUNTER — Inpatient Hospital Stay
Admission: EM | Admit: 2022-11-14 | Discharge: 2022-11-17 | DRG: 683 | Disposition: A | Discharge status: Home / Self Care | Payer: Medicare PPO | Source: Ambulatory Visit | Attending: Hospitalist | Admitting: Hospitalist

## 2022-11-14 DIAGNOSIS — N12 Tubulo-interstitial nephritis, not specified as acute or chronic: Secondary | ICD-10-CM

## 2022-11-14 DIAGNOSIS — R1084 Generalized abdominal pain: Secondary | ICD-10-CM

## 2022-11-14 DIAGNOSIS — K219 Gastro-esophageal reflux disease without esophagitis: Secondary | ICD-10-CM | POA: Insufficient documentation

## 2022-11-14 DIAGNOSIS — E871 Hypo-osmolality and hyponatremia: Secondary | ICD-10-CM

## 2022-11-14 DIAGNOSIS — R1030 Lower abdominal pain, unspecified: Secondary | ICD-10-CM

## 2022-11-14 DIAGNOSIS — I10 Essential (primary) hypertension: Secondary | ICD-10-CM | POA: Insufficient documentation

## 2022-11-14 DIAGNOSIS — Z79899 Other long term (current) drug therapy: Secondary | ICD-10-CM | POA: Insufficient documentation

## 2022-11-14 DIAGNOSIS — Z1152 Encounter for screening for COVID-19: Secondary | ICD-10-CM | POA: Insufficient documentation

## 2022-11-14 DIAGNOSIS — R112 Nausea with vomiting, unspecified: Secondary | ICD-10-CM

## 2022-11-14 DIAGNOSIS — R7303 Prediabetes: Secondary | ICD-10-CM | POA: Insufficient documentation

## 2022-11-14 DIAGNOSIS — E279 Disorder of adrenal gland, unspecified: Secondary | ICD-10-CM | POA: Insufficient documentation

## 2022-11-14 DIAGNOSIS — Z681 Body mass index (BMI) 19 or less, adult: Secondary | ICD-10-CM

## 2022-11-14 DIAGNOSIS — Z885 Allergy status to narcotic agent status: Secondary | ICD-10-CM

## 2022-11-14 DIAGNOSIS — Z91018 Allergy to other foods: Secondary | ICD-10-CM

## 2022-11-14 DIAGNOSIS — Z7984 Long term (current) use of oral hypoglycemic drugs: Secondary | ICD-10-CM | POA: Insufficient documentation

## 2022-11-14 DIAGNOSIS — N179 Acute kidney failure, unspecified: Principal | ICD-10-CM

## 2022-11-14 DIAGNOSIS — N10 Acute tubulo-interstitial nephritis: Secondary | ICD-10-CM | POA: Diagnosis present

## 2022-11-14 DIAGNOSIS — I2510 Atherosclerotic heart disease of native coronary artery without angina pectoris: Secondary | ICD-10-CM | POA: Diagnosis present

## 2022-11-14 DIAGNOSIS — E1165 Type 2 diabetes mellitus with hyperglycemia: Secondary | ICD-10-CM

## 2022-11-14 DIAGNOSIS — F17210 Nicotine dependence, cigarettes, uncomplicated: Secondary | ICD-10-CM | POA: Insufficient documentation

## 2022-11-14 DIAGNOSIS — B9620 Unspecified Escherichia coli [E. coli] as the cause of diseases classified elsewhere: Secondary | ICD-10-CM | POA: Diagnosis present

## 2022-11-14 DIAGNOSIS — Z7982 Long term (current) use of aspirin: Secondary | ICD-10-CM

## 2022-11-14 DIAGNOSIS — N390 Urinary tract infection, site not specified: Secondary | ICD-10-CM

## 2022-11-14 DIAGNOSIS — R058 Other specified cough: Secondary | ICD-10-CM | POA: Insufficient documentation

## 2022-11-14 DIAGNOSIS — R17 Unspecified jaundice: Secondary | ICD-10-CM | POA: Diagnosis present

## 2022-11-14 DIAGNOSIS — D649 Anemia, unspecified: Secondary | ICD-10-CM | POA: Insufficient documentation

## 2022-11-14 DIAGNOSIS — E278 Other specified disorders of adrenal gland: Secondary | ICD-10-CM | POA: Diagnosis present

## 2022-11-14 DIAGNOSIS — R64 Cachexia: Secondary | ICD-10-CM | POA: Diagnosis present

## 2022-11-14 DIAGNOSIS — G479 Sleep disorder, unspecified: Secondary | ICD-10-CM | POA: Diagnosis present

## 2022-11-14 DIAGNOSIS — E872 Acidosis, unspecified: Secondary | ICD-10-CM | POA: Diagnosis present

## 2022-11-14 LAB — RESP PANEL BY RT-PCR (RSV, FLU A&B, COVID)  RVPGX2
Influenza A by PCR: NEGATIVE
Influenza B by PCR: NEGATIVE
Resp Syncytial Virus by PCR: NEGATIVE
SARS Coronavirus 2 by RT PCR: NEGATIVE

## 2022-11-14 LAB — CBC WITH DIFFERENTIAL/PLATELET
Abs Immature Granulocytes: 0.1 10*3/uL — ABNORMAL HIGH (ref 0.00–0.07)
Abs Immature Granulocytes: 0.07 10*3/uL (ref 0.00–0.07)
Basophils Absolute: 0 10*3/uL (ref 0.0–0.1)
Basophils Absolute: 0 10*3/uL (ref 0.0–0.1)
Basophils Relative: 0 %
Basophils Relative: 0 %
Eosinophils Absolute: 0.3 10*3/uL (ref 0.0–0.5)
Eosinophils Absolute: 0 10*3/uL (ref 0.0–0.5)
Eosinophils Relative: 3 %
Eosinophils Relative: 0 %
HCT: 32.3 % — ABNORMAL LOW (ref 36.0–46.0)
HCT: 32.3 % — ABNORMAL LOW (ref 36.0–46.0)
Hemoglobin: 11.2 g/dL — ABNORMAL LOW (ref 12.0–15.0)
Hemoglobin: 10.8 g/dL — ABNORMAL LOW (ref 12.0–15.0)
Immature Granulocytes: 1 %
Immature Granulocytes: 1 %
Lymphocytes Relative: 5 %
Lymphocytes Relative: 6 %
Lymphs Abs: 0.5 10*3/uL — ABNORMAL LOW (ref 0.7–4.0)
Lymphs Abs: 0.5 10*3/uL — ABNORMAL LOW (ref 0.7–4.0)
MCH: 29.1 pg (ref 26.0–34.0)
MCH: 28.3 pg (ref 26.0–34.0)
MCHC: 34.7 g/dL (ref 30.0–36.0)
MCHC: 33.4 g/dL (ref 30.0–36.0)
MCV: 83.9 fL (ref 80.0–100.0)
MCV: 84.6 fL (ref 80.0–100.0)
Monocytes Absolute: 0.5 10*3/uL (ref 0.1–1.0)
Monocytes Absolute: 0.6 10*3/uL (ref 0.1–1.0)
Monocytes Relative: 5 %
Monocytes Relative: 8 %
Neutro Abs: 8.5 10*3/uL — ABNORMAL HIGH (ref 1.7–7.7)
Neutro Abs: 7.1 10*3/uL (ref 1.7–7.7)
Neutrophils Relative %: 86 %
Neutrophils Relative %: 85 %
Platelets: 242 10*3/uL (ref 150–400)
Platelets: 231 10*3/uL (ref 150–400)
RBC: 3.85 MIL/uL — ABNORMAL LOW (ref 3.87–5.11)
RBC: 3.82 MIL/uL — ABNORMAL LOW (ref 3.87–5.11)
RDW: 13.1 % (ref 11.5–15.5)
RDW: 12.7 % (ref 11.5–15.5)
WBC: 10 10*3/uL (ref 4.0–10.5)
WBC: 8.3 10*3/uL (ref 4.0–10.5)
nRBC: 0 % (ref 0.0–0.2)
nRBC: 0 % (ref 0.0–0.2)

## 2022-11-14 LAB — COMPREHENSIVE METABOLIC PANEL
ALT: 19 U/L (ref 0–44)
ALT: 18 U/L (ref 0–44)
AST: 29 U/L (ref 15–41)
AST: 29 U/L (ref 15–41)
Albumin: 2.9 g/dL — ABNORMAL LOW (ref 3.5–5.0)
Albumin: 2.9 g/dL — ABNORMAL LOW (ref 3.5–5.0)
Alkaline Phosphatase: 90 U/L (ref 38–126)
Alkaline Phosphatase: 88 U/L (ref 38–126)
Anion gap: 13 (ref 5–15)
Anion gap: 13 (ref 5–15)
BUN: 54 mg/dL — ABNORMAL HIGH (ref 8–23)
BUN: 54 mg/dL — ABNORMAL HIGH (ref 8–23)
CO2: 18 mmol/L — ABNORMAL LOW (ref 22–32)
CO2: 20 mmol/L — ABNORMAL LOW (ref 22–32)
Calcium: 7.8 mg/dL — ABNORMAL LOW (ref 8.9–10.3)
Calcium: 8.2 mg/dL — ABNORMAL LOW (ref 8.9–10.3)
Chloride: 90 mmol/L — ABNORMAL LOW (ref 98–111)
Chloride: 92 mmol/L — ABNORMAL LOW (ref 98–111)
Creatinine, Ser: 2.01 mg/dL — ABNORMAL HIGH (ref 0.44–1.00)
Creatinine, Ser: 1.95 mg/dL — ABNORMAL HIGH (ref 0.44–1.00)
GFR, Estimated: 25 mL/min — ABNORMAL LOW (ref >60)
GFR, Estimated: 26 mL/min — ABNORMAL LOW (ref >60)
Glucose, Bld: 221 mg/dL — ABNORMAL HIGH (ref 70–99)
Glucose, Bld: 210 mg/dL — ABNORMAL HIGH (ref 70–99)
Potassium: 4.1 mmol/L (ref 3.5–5.1)
Potassium: 4 mmol/L (ref 3.5–5.1)
Sodium: 121 mmol/L — ABNORMAL LOW (ref 135–145)
Sodium: 125 mmol/L — ABNORMAL LOW (ref 135–145)
Total Bilirubin: 1.7 mg/dL — ABNORMAL HIGH (ref 0.3–1.2)
Total Bilirubin: 2 mg/dL — ABNORMAL HIGH (ref 0.3–1.2)
Total Protein: 7.1 g/dL (ref 6.5–8.1)
Total Protein: 7 g/dL (ref 6.5–8.1)

## 2022-11-14 MED ORDER — ONDANSETRON HCL 4 MG/2ML IJ SOLN
4.0000 mg | Freq: Once | INTRAMUSCULAR | Status: AC
  Administered 2022-11-15: 4 mg via INTRAVENOUS
  Filled 2022-11-14: qty 2

## 2022-11-14 MED ORDER — SODIUM CHLORIDE 0.9 % IV BOLUS
1000.0000 mL | Freq: Once | INTRAVENOUS | Status: AC
  Administered 2022-11-14: 1000 mL via INTRAVENOUS

## 2022-11-14 NOTE — ED Provider Notes (Signed)
[facility] Provider Note    Event Date/Time   First MD Initiated Contact with Patient 11/14/22 2302     (approximate)   History   Abnormal labs   HPI  Gail Carroll is a 77 y.o. female brought to the ED via EMS from urgent care with a chief complaint of abnormal lab work.  Patient complains of a 3-day history of lower abdominal pain associated with nausea and vomiting.  Daughter with bacterial colitis and patient was told to bleach her house.  Endorses generalized weakness.  Urgent care performed lab work and had patient drinking oral contrast in preparation for CT scan but truncated the scan when her labs came back demonstrating hyponatremia and acute kidney injury.  Patient denies fever/chills, chest pain, shortness of breath, dysuria or diarrhea.     Past Medical History   Past Medical History:  Diagnosis Date   GERD (gastroesophageal reflux disease)    Hypertension    Pre-diabetes      Active Problem List  There are no problems to display for this patient.    Past Surgical History   Past Surgical History:  Procedure Laterality Date   BACK SURGERY     c5-6   COLONOSCOPY N/A 03/02/2022   Procedure: COLONOSCOPY;  Surgeon: Annamaria Helling, DO;  Location: [facility];  Service: Gastroenterology;  Laterality: N/A;   DILATATION & CURETTAGE/HYSTEROSCOPY WITH MYOSURE N/A 12/27/2015   Procedure: DILATATION & CURETTAGE/HYSTEROSCOPY WITH MYOSURE/POLYPECTOMY;  Surgeon: Will Bonnet, MD;  Location: [facility];  Service: Gynecology;  Laterality: N/A;   TUBAL LIGATION       Home Medications   Prior to Admission medications   Medication Sig Start Date End Date Taking? Authorizing Provider  acetaminophen (TYLENOL) 325 MG tablet Take 650 mg by mouth every morning.    [provider]  amLODipine (NORVASC) 10 MG tablet Take 10 mg by mouth daily.    [provider]  aspirin 81 MG tablet Take 81 mg by mouth daily.    [provider]  Choline Fenofibrate (FENOFIBRIC ACID PO) Take 45 mg by mouth daily at 6 (six) AM.    [provider]  diphenhydramine-acetaminophen (TYLENOL PM) 25-500 MG TABS tablet Take 1 tablet by mouth at bedtime as needed.    [provider]  hydrochlorothiazide (MICROZIDE) 12.5 MG capsule Take 12.5 mg by mouth daily. 08/14/22   [provider]  ibuprofen (ADVIL,MOTRIN) 600 MG tablet Take 1 tablet (600 mg total) by mouth every 6 (six) hours as needed for mild pain or cramping. 12/27/15   Will Bonnet, MD  ketorolac (ACULAR) 0.5 % ophthalmic solution 1 drop 4 (four) times daily.    [provider]  linagliptin (TRADJENTA) 5 MG TABS tablet Take 5 mg by mouth daily.    [provider]  losartan (COZAAR) 50 MG tablet Take 50 mg by mouth daily.    [provider]  lovastatin (MEVACOR) 40 MG tablet Take by mouth every morning.    [provider]  moxifloxacin (VIGAMOX) 0.5 % ophthalmic solution Place 1 drop into both eyes 3 (three) times daily.    [provider]  omeprazole (PRILOSEC) 40 MG capsule Take 40 mg by mouth daily.    [provider]  OVER THE COUNTER MEDICATION [employer] cold and allergy    [provider]     Allergies  Tea and Valium [diazepam]   Family History  History reviewed. No pertinent family history.  Physical Exam  Triage Vital Signs: ED Triage Vitals  Enc Vitals Group     BP 11/14/22 2125 (!) 111/58     Pulse Rate 11/14/22 2125 97     Resp 11/14/22 2125 18     Temp 11/14/22 2125 98.9 F (37.2 C)     Temp Source 11/14/22 2125 Oral     SpO2 11/14/22 2115 98 %     Weight 11/14/22 2123 122 lb (55.3 kg)     Height 11/14/22 2123 '5\' 7"'$  (1.702 m)     Head Circumference --      Peak Flow --      Pain Score 11/14/22 2120 0     Pain Loc --      Pain Edu? --      Excl. in [place]? --     Updated Vital Signs: BP (!) 109/59   Pulse 94   Temp 98.9 F (37.2 C) (Oral)    Resp 14   Ht '5\' 7"'$  (1.702 m)   Wt 55.3 kg   SpO2 95%   BMI 19.11 kg/m    General: Awake, mild distress.  Moderately dry mucous membranes.  Cachectic. CV:  RRR.  Good peripheral perfusion.  Resp:  Normal effort.  CTAB. Abd:  Mildly tender to lower abdomen without rebound or guarding.  No distention.  Other:  No truncal vesicles.   ED Results / Procedures / Treatments  Labs (all labs ordered are listed, but only abnormal results are displayed) Labs Reviewed  CBC WITH DIFFERENTIAL/PLATELET - Abnormal; Notable for the following components:      Result Value   RBC 3.82 (*)    Hemoglobin 10.8 (*)    HCT 32.3 (*)    Lymphs Abs 0.5 (*)    All other components within normal limits  COMPREHENSIVE METABOLIC PANEL - Abnormal; Notable for the following components:   Sodium 125 (*)    Chloride 92 (*)    CO2 20 (*)    Glucose, Bld 210 (*)    BUN 54 (*)    Creatinine, Ser 1.95 (*)    Calcium 8.2 (*)    Albumin 2.9 (*)    Total Bilirubin 2.0 (*)    GFR, Estimated 26 (*)    All other components within normal limits  URINALYSIS, ROUTINE W REFLEX MICROSCOPIC  TROPONIN I (HIGH SENSITIVITY)     EKG  ED ECG REPORT I, Yarenis Cerino J, the attending physician, personally viewed and interpreted this ECG.   Date: 11/14/2022  EKG Time: ***  Rate: ***  Rhythm: {ekg findings:315101}  Axis: ***  Intervals:{conduction defects:17367}  ST&T Change: ***    RADIOLOGY I have independently visualized and interpreted patient's x-ray and CT scan as well as noted the radiology interpretation:  Chest x-ray:  CT abdomen/pelvis:  Official radiology report(s): No results found.   PROCEDURES:  Critical Care performed: {CriticalCareYesNo:19197::"Yes, see critical care procedure note(s)","No"}  .1-3 Lead EKG Interpretation  Performed by: Paulette Blanch, MD Authorized by: Paulette Blanch, MD     Interpretation: normal     ECG rate:  95   ECG rate assessment: normal     Rhythm: sinus rhythm      Ectopy: none     Conduction: normal   Comments:     Patient placed on cardiac monitor to evaluate for arrhythmias    MEDICATIONS ORDERED IN ED: Medications  ondansetron (ZOFRAN) injection 4 mg (has no administration in time range)  sodium chloride 0.9 % bolus 1,000 mL (1,000  mLs Intravenous New Bag/Given 11/14/22 2309)     IMPRESSION / MDM / ASSESSMENT AND PLAN / ED COURSE  I reviewed the triage vital signs and the nursing notes.                             77 year old female sent from urgent care for abnormal lab work, lower abdominal pain, nausea and vomiting. Differential diagnosis includes, but is not limited to, ovarian cyst, ovarian torsion, acute appendicitis, diverticulitis, urinary tract infection/pyelonephritis, endometriosis, bowel obstruction, colitis, renal colic, gastroenteritis, hernia, etc. I have personally reviewed patient's records and note her urgent care visit from today.  Patient's presentation is most consistent with acute presentation with potential threat to life or bodily function.  The patient is on the cardiac monitor to evaluate for evidence of arrhythmia and/or significant heart rate changes.  Laboratory results demonstrate normal WBC 8.3, hyponatremia with sodium 125, AKI BUN 54/creatinine 1.95.  Will obtain EKG, troponin, urinalysis.  Initiate IV fluid hydration, IV Zofran for nausea.  Will proceed with CT abdomen/pelvis.  Anticipate hospitalization.      FINAL CLINICAL IMPRESSION(S) / ED DIAGNOSES   Final diagnoses:  Hyponatremia  AKI (acute kidney injury) ([place])  Lower abdominal pain  Nausea and vomiting, unspecified vomiting type     Rx / DC Orders   ED Discharge Orders     None        Note:  This document was prepared using Dragon voice recognition software and may include unintentional dictation errors.

## 2022-11-14 NOTE — ED Triage Notes (Signed)
EMS brings pt in from [facility] for c/o abd pain x 4 days and abnormal labs

## 2022-11-14 NOTE — ED Triage Notes (Signed)
Pt c/o SOB, fatigue, cough, nasal congestion, abdominal pain x4days  Pt states that her daughter had a stroke and had bacterial colonitis and believes she has bacterial colonitis as well.

## 2022-11-14 NOTE — ED Notes (Signed)
Patient is being discharged from the Urgent Care and sent to the Emergency Department via EMS . Per Dr. Susa Simmonds, patient is in need of higher level of care due to abdominal pain and hyponatremia. Patient is aware and verbalizes understanding of plan of care.  Vitals:   11/14/22 1831  BP: 94/76  Pulse: (!) 107  Temp: 99.8 F (37.7 C)  SpO2: 95%

## 2022-11-14 NOTE — ED Provider Notes (Signed)
[facility]    CSN: [id number] Arrival date & time: 11/14/22  1750      History   Chief Complaint Chief Complaint  Patient presents with   Abdominal Pain   Flank Pain    HPI Gail Carroll is a 77 y.o. female.   HPI   Gail Carroll presents for generalized weakness that is not improving since.  States that she was exposed to " bacterial colitis" by her daughter who just got out of the hospital.  States that she was told to go home and clean her bathroom with bleach.  Regarding fever-husband reports that she felt warm but they did not take her temperature.   She is having some shortness of breath, cough, nasal congestion and abdominal pain with nausea and vomiting for the past 3-4 days.  He has not been wanting to eat any food but has been drinking fluids.  Denies diarrhea.   Fever : yes documented Chills: yes  Sore throat: yes   Cough: yes Sputum: yes Nasal congestion: no Rhinorrhea: yes Myalgias: yes Appetite: decreased Hydration: normal  Abdominal pain: yes Nausea: yes Vomiting: yes Diarrhea: No Rash: No Sleep disturbance: yes Headache: no      Past Medical History:  Diagnosis Date   GERD (gastroesophageal reflux disease)    Hypertension    Pre-diabetes     There are no problems to display for this patient.   Past Surgical History:  Procedure Laterality Date   BACK SURGERY     c5-6   COLONOSCOPY N/A 03/02/2022   Procedure: COLONOSCOPY;  Surgeon: Annamaria Helling, DO;  Location: [facility];  Service: Gastroenterology;  Laterality: N/A;   DILATATION & CURETTAGE/HYSTEROSCOPY WITH MYOSURE N/A 12/27/2015   Procedure: DILATATION & CURETTAGE/HYSTEROSCOPY WITH MYOSURE/POLYPECTOMY;  Surgeon: Will Bonnet, MD;  Location: [facility] ORS;  Service: Gynecology;  Laterality: N/A;   TUBAL LIGATION      OB History   No obstetric history on file.      Home Medications    Prior to Admission medications   Medication Sig Start Date End Date  Taking? Authorizing Provider  acetaminophen (TYLENOL) 325 MG tablet Take 650 mg by mouth every morning.   Yes [provider]  amLODipine (NORVASC) 10 MG tablet Take 10 mg by mouth daily.   Yes [provider]  aspirin 81 MG tablet Take 81 mg by mouth daily.   Yes [provider]  Choline Fenofibrate (FENOFIBRIC ACID PO) Take 45 mg by mouth daily at 6 (six) AM.   Yes [provider]  diphenhydramine-acetaminophen (TYLENOL PM) 25-500 MG TABS tablet Take 1 tablet by mouth at bedtime as needed.   Yes [provider]  hydrochlorothiazide (MICROZIDE) 12.5 MG capsule Take 12.5 mg by mouth daily. 08/14/22  Yes [provider]  ibuprofen (ADVIL,MOTRIN) 600 MG tablet Take 1 tablet (600 mg total) by mouth every 6 (six) hours as needed for mild pain or cramping. 12/27/15  Yes Will Bonnet, MD  linagliptin (TRADJENTA) 5 MG TABS tablet Take 5 mg by mouth daily.   Yes [provider]  losartan (COZAAR) 50 MG tablet Take 50 mg by mouth daily.   Yes [provider]  lovastatin (MEVACOR) 40 MG tablet Take by mouth every morning.   Yes [provider]  omeprazole (PRILOSEC) 40 MG capsule Take 40 mg by mouth daily.   Yes [provider]  OVER THE COUNTER MEDICATION [employer] cold and allergy   Yes [provider]  ketorolac (ACULAR) 0.5 % ophthalmic solution 1 drop 4 (four) times daily.    [provider]  moxifloxacin (VIGAMOX) 0.5 % ophthalmic solution Place 1 drop into both eyes 3 (three) times daily.    [provider]    Family History History reviewed. No pertinent family history.  Social History Social History   Tobacco Use   Smoking status: Every Day    Types: Cigarettes   Smokeless tobacco: Never  Vaping Use   Vaping Use: Never used  Substance Use Topics   Alcohol use: No   Drug use: Never     Allergies   Tea and Valium [diazepam]   Review of Systems Review of  Systems: negative unless otherwise stated in HPI.      Physical Exam Triage Vital Signs ED Triage Vitals  Enc Vitals Group     BP 11/14/22 1831 94/76     Pulse Rate 11/14/22 1831 (!) 107     Resp --      Temp 11/14/22 1831 99.8 F (37.7 C)     Temp Source 11/14/22 1831 Oral     SpO2 11/14/22 1831 95 %     Weight 11/14/22 1827 123 lb (55.8 kg)     Height 11/14/22 1827 '5\' 7"'$  (1.702 m)     Head Circumference --      Peak Flow --      Pain Score 11/14/22 1827 8     Pain Loc --      Pain Edu? --      Excl. in [place]? --    No data found.  Updated Vital Signs BP 94/76 (BP Location: Left Arm)   Pulse (!) 107   Temp 99.8 F (37.7 C) (Oral)   Ht '5\' 7"'$  (1.702 m)   Wt 55.8 kg   SpO2 95%   BMI 19.26 kg/m   Visual Acuity Right Eye Distance:   Left Eye Distance:   Bilateral Distance:    Right Eye Near:   Left Eye Near:    Bilateral Near:     Physical Exam GEN:     alert, ill- appearing female    HENT:  mucus membranes moist, oropharyngeal without lesions  EYES:   no scleral injection or icterus  NECK:  ROM baseline, no meningismus   RESP:  no increased work of breathing, clear to auscultation bilaterally CVS:   regular  rhythm, tachycardic ABD:   soft, generalized tenderness, no rebound, no guarding  Skin:   warm and dry    UC Treatments / Results  Labs (all labs ordered are listed, but only abnormal results are displayed) Labs Reviewed  COMPREHENSIVE METABOLIC PANEL - Abnormal; Notable for the following components:      Result Value   Sodium 121 (*)    Chloride 90 (*)    CO2 18 (*)    Glucose, Bld 221 (*)    BUN 54 (*)    Creatinine, Ser 2.01 (*)    Calcium 7.8 (*)    Albumin 2.9 (*)    Total Bilirubin 1.7 (*)    GFR, Estimated 25 (*)    All other components within normal limits  CBC WITH DIFFERENTIAL/PLATELET - Abnormal; Notable for the following components:   RBC 3.85 (*)    Hemoglobin 11.2 (*)    HCT 32.3 (*)    Neutro Abs 8.5 (*)    Lymphs Abs 0.5  (*)    Abs Immature Granulocytes 0.10 (*)    All other components within normal  limits  RESP PANEL BY RT-PCR (RSV, FLU A&B, COVID)  [id number]  C DIFFICILE QUICK SCREEN W PCR REFLEX      EKG   Radiology No results found.  Procedures Procedures (including critical care time)  Medications Ordered in UC Medications - No data to display  Initial Impression / Assessment and Plan / UC Course  I have reviewed the triage vital signs and the nursing notes.  Pertinent labs & imaging results that were available during my care of the patient were reviewed by me and considered in my medical decision making (see chart for details).       Pt is a 77 y.o. female who presents for 4 days of gastrointestinal and respiratory symptoms. Gail Carroll has an elevated temperature here of 99.8 F and she is tachycardic.  Satting 95% on room air. Pt is ill-appearing, well hydrated, without respiratory distress. Pulmonary exam is unremarkable. She has generalized abdominal tenderness. CBC and CMP ordered.   COVID, influenza and RSV are negative. CT ABD Pelvis ordered for possible colitis given exposure to what sounds like C. difficile. Pt began drinking the oral contrast. CBC did not show leukocytosis but she has a normocytic anemia (hemoglobin 11.2).  Her CMP is significant for hypochloremic hyponatremia Na 121, Cl 90, non-anion gap acidosis. Serum creatine 2.0 with previous from 6 years ago 1.04.  She has elevated total bilirubin. No IVF given.   Given her serum creatine and hyponatremia with her symptoms her work-up was truncated as it is best that she be evaluated in the ED. Discussed ED evaluation with patient and her husband who agree with plan.     Called [facility] RN to update her on patient and arrival by EMS. She will await her arrival.   Discussed MDM, treatment plan and plan for follow-up with patient who agrees with plan.     Final Clinical Impressions(s) / UC Diagnoses   Final diagnoses:  Hyponatremia   Generalized abdominal pain     Discharge Instructions      You have been advised to follow up immediately in the emergency department for concerning signs or symptoms as discussed during your visit. You will be transported via EMS.          ED Prescriptions   None    PDMP not reviewed this encounter.   Lyndee Hensen, DO 11/14/22 2036

## 2022-11-14 NOTE — ED Triage Notes (Signed)
Pt presents via [facility] from UC with complaints of abnormal labs. Pt has been having some lower abdominal pain with associated N/V and constipation since Sunday. Pt was seen at [facility] and was told she had some critical abnormal labs including: NA 121 & Creat 2.01. A&Ox4 at this time. Denies CP or SOB.

## 2022-11-14 NOTE — Discharge Instructions (Signed)
You have been advised to follow up immediately in the emergency department for concerning signs or symptoms as discussed during your visit. You will be transported via EMS.

## 2022-11-14 NOTE — ED Notes (Signed)
Bladder scan volume 56m. Verified by  2 RNs

## 2022-11-15 ENCOUNTER — Encounter: Payer: Self-pay | Admitting: Internal Medicine

## 2022-11-15 ENCOUNTER — Emergency Department: Payer: Medicare PPO

## 2022-11-15 ENCOUNTER — Other Ambulatory Visit: Payer: Self-pay

## 2022-11-15 DIAGNOSIS — Z7984 Long term (current) use of oral hypoglycemic drugs: Secondary | ICD-10-CM | POA: Diagnosis not present

## 2022-11-15 DIAGNOSIS — R17 Unspecified jaundice: Secondary | ICD-10-CM | POA: Diagnosis present

## 2022-11-15 DIAGNOSIS — Z681 Body mass index (BMI) 19 or less, adult: Secondary | ICD-10-CM | POA: Diagnosis not present

## 2022-11-15 DIAGNOSIS — E871 Hypo-osmolality and hyponatremia: Principal | ICD-10-CM

## 2022-11-15 DIAGNOSIS — E1165 Type 2 diabetes mellitus with hyperglycemia: Secondary | ICD-10-CM

## 2022-11-15 DIAGNOSIS — N179 Acute kidney failure, unspecified: Secondary | ICD-10-CM | POA: Diagnosis present

## 2022-11-15 DIAGNOSIS — I10 Essential (primary) hypertension: Secondary | ICD-10-CM | POA: Insufficient documentation

## 2022-11-15 DIAGNOSIS — I2510 Atherosclerotic heart disease of native coronary artery without angina pectoris: Secondary | ICD-10-CM | POA: Diagnosis present

## 2022-11-15 DIAGNOSIS — Z885 Allergy status to narcotic agent status: Secondary | ICD-10-CM | POA: Diagnosis not present

## 2022-11-15 DIAGNOSIS — Z1152 Encounter for screening for COVID-19: Secondary | ICD-10-CM | POA: Diagnosis not present

## 2022-11-15 DIAGNOSIS — D649 Anemia, unspecified: Secondary | ICD-10-CM | POA: Insufficient documentation

## 2022-11-15 DIAGNOSIS — K219 Gastro-esophageal reflux disease without esophagitis: Secondary | ICD-10-CM | POA: Diagnosis present

## 2022-11-15 DIAGNOSIS — E872 Acidosis, unspecified: Secondary | ICD-10-CM | POA: Diagnosis present

## 2022-11-15 DIAGNOSIS — N10 Acute tubulo-interstitial nephritis: Secondary | ICD-10-CM | POA: Diagnosis present

## 2022-11-15 DIAGNOSIS — R1084 Generalized abdominal pain: Secondary | ICD-10-CM | POA: Diagnosis present

## 2022-11-15 DIAGNOSIS — F17210 Nicotine dependence, cigarettes, uncomplicated: Secondary | ICD-10-CM | POA: Diagnosis present

## 2022-11-15 DIAGNOSIS — E278 Other specified disorders of adrenal gland: Secondary | ICD-10-CM | POA: Insufficient documentation

## 2022-11-15 DIAGNOSIS — Z79899 Other long term (current) drug therapy: Secondary | ICD-10-CM | POA: Diagnosis not present

## 2022-11-15 DIAGNOSIS — R64 Cachexia: Secondary | ICD-10-CM | POA: Diagnosis present

## 2022-11-15 DIAGNOSIS — G479 Sleep disorder, unspecified: Secondary | ICD-10-CM | POA: Diagnosis present

## 2022-11-15 DIAGNOSIS — Z91018 Allergy to other foods: Secondary | ICD-10-CM | POA: Diagnosis not present

## 2022-11-15 DIAGNOSIS — B9620 Unspecified Escherichia coli [E. coli] as the cause of diseases classified elsewhere: Secondary | ICD-10-CM | POA: Diagnosis present

## 2022-11-15 DIAGNOSIS — Z7982 Long term (current) use of aspirin: Secondary | ICD-10-CM | POA: Diagnosis not present

## 2022-11-15 LAB — TROPONIN I (HIGH SENSITIVITY): Troponin I (High Sensitivity): 8 ng/L (ref <18)

## 2022-11-15 LAB — COMPREHENSIVE METABOLIC PANEL
ALT: 17 U/L (ref 0–44)
AST: 26 U/L (ref 15–41)
Albumin: 2.7 g/dL — ABNORMAL LOW (ref 3.5–5.0)
Alkaline Phosphatase: 81 U/L (ref 38–126)
Anion gap: 13 (ref 5–15)
BUN: 48 mg/dL — ABNORMAL HIGH (ref 8–23)
CO2: 20 mmol/L — ABNORMAL LOW (ref 22–32)
Calcium: 8 mg/dL — ABNORMAL LOW (ref 8.9–10.3)
Chloride: 97 mmol/L — ABNORMAL LOW (ref 98–111)
Creatinine, Ser: 1.65 mg/dL — ABNORMAL HIGH (ref 0.44–1.00)
GFR, Estimated: 32 mL/min — ABNORMAL LOW (ref >60)
Glucose, Bld: 165 mg/dL — ABNORMAL HIGH (ref 70–99)
Potassium: 4 mmol/L (ref 3.5–5.1)
Sodium: 130 mmol/L — ABNORMAL LOW (ref 135–145)
Total Bilirubin: 1.5 mg/dL — ABNORMAL HIGH (ref 0.3–1.2)
Total Protein: 6.7 g/dL (ref 6.5–8.1)

## 2022-11-15 LAB — CORTISOL-AM, BLOOD: Cortisol - AM: 22.3 ug/dL (ref 6.7–22.6)

## 2022-11-15 LAB — PROTIME-INR
INR: 1.2 (ref 0.8–1.2)
Prothrombin Time: 14.8 seconds (ref 11.4–15.2)

## 2022-11-15 LAB — GLUCOSE, CAPILLARY
Glucose-Capillary: 170 mg/dL — ABNORMAL HIGH (ref 70–99)
Glucose-Capillary: 188 mg/dL — ABNORMAL HIGH (ref 70–99)
Glucose-Capillary: 177 mg/dL — ABNORMAL HIGH (ref 70–99)

## 2022-11-15 LAB — URINALYSIS, ROUTINE W REFLEX MICROSCOPIC
Bilirubin Urine: NEGATIVE
Glucose, UA: NEGATIVE mg/dL
Ketones, ur: NEGATIVE mg/dL
Nitrite: NEGATIVE
Protein, ur: 30 mg/dL — AB
Specific Gravity, Urine: 1.009 (ref 1.005–1.030)
WBC, UA: >50 WBC/hpf (ref 0–5)
pH: 5 (ref 5.0–8.0)

## 2022-11-15 LAB — CBG MONITORING, ED: Glucose-Capillary: 221 mg/dL — ABNORMAL HIGH (ref 70–99)

## 2022-11-15 LAB — HEMOGLOBIN A1C
Hgb A1c MFr Bld: 6.6 % — ABNORMAL HIGH (ref 4.8–5.6)
Mean Plasma Glucose: 143 mg/dL

## 2022-11-15 LAB — PROCALCITONIN: Procalcitonin: 49.77 ng/mL

## 2022-11-15 MED ORDER — ONDANSETRON HCL 4 MG/2ML IJ SOLN: 4.0000 mg | Freq: Four times a day (QID) | INTRAMUSCULAR | Status: DC | PRN

## 2022-11-15 MED ORDER — ACETAMINOPHEN 325 MG PO TABS
650.0000 mg | ORAL_TABLET | Freq: Four times a day (QID) | ORAL | Status: DC | PRN
  Administered 2022-11-17: 650 mg via ORAL
  Filled 2022-11-15: qty 2

## 2022-11-15 MED ORDER — INSULIN ASPART 100 UNIT/ML IJ SOLN: 0.0000 [IU] | Freq: Every day | INTRAMUSCULAR | Status: DC

## 2022-11-15 MED ORDER — INSULIN ASPART 100 UNIT/ML IJ SOLN
0.0000 [IU] | Freq: Three times a day (TID) | INTRAMUSCULAR | Status: DC
  Administered 2022-11-15: 2 [IU] via SUBCUTANEOUS
  Administered 2022-11-15: 3 [IU] via SUBCUTANEOUS
  Administered 2022-11-15 – 2022-11-16 (×4): 2 [IU] via SUBCUTANEOUS
  Filled 2022-11-15 (×6): qty 1

## 2022-11-15 MED ORDER — SODIUM CHLORIDE 0.9 % IV SOLN
1.0000 g | Freq: Once | INTRAVENOUS | Status: AC
  Administered 2022-11-15: 1 g via INTRAVENOUS
  Filled 2022-11-15: qty 10

## 2022-11-15 MED ORDER — ACETAMINOPHEN 650 MG RE SUPP: 650.0000 mg | Freq: Four times a day (QID) | RECTAL | Status: DC | PRN

## 2022-11-15 MED ORDER — SODIUM CHLORIDE 0.9 % IV SOLN
150.0000 mL/h | INTRAVENOUS | Status: DC
  Administered 2022-11-15: 150 mL/h via INTRAVENOUS

## 2022-11-15 MED ORDER — HYDROCODONE-ACETAMINOPHEN 5-325 MG PO TABS
1.0000 | ORAL_TABLET | ORAL | Status: DC | PRN
  Filled 2022-11-15: qty 2

## 2022-11-15 MED ORDER — ONDANSETRON HCL 4 MG PO TABS: 4.0000 mg | ORAL_TABLET | Freq: Four times a day (QID) | ORAL | Status: DC | PRN

## 2022-11-15 MED ORDER — ASPIRIN 81 MG PO TBEC
81.0000 mg | DELAYED_RELEASE_TABLET | Freq: Every day | ORAL | Status: DC
  Administered 2022-11-15 – 2022-11-17 (×3): 81 mg via ORAL
  Filled 2022-11-15 (×3): qty 1

## 2022-11-15 MED ORDER — MORPHINE SULFATE (PF) 2 MG/ML IV SOLN: 2.0000 mg | INTRAVENOUS | Status: DC | PRN

## 2022-11-15 MED ORDER — PRAVASTATIN SODIUM 20 MG PO TABS
40.0000 mg | ORAL_TABLET | Freq: Every day | ORAL | Status: DC
  Administered 2022-11-15 – 2022-11-16 (×3): 40 mg via ORAL
  Filled 2022-11-15 (×3): qty 2

## 2022-11-15 MED ORDER — SODIUM CHLORIDE 0.9 % IV SOLN
2.0000 g | INTRAVENOUS | Status: DC
  Administered 2022-11-16 – 2022-11-17 (×2): 2 g via INTRAVENOUS
  Filled 2022-11-15 (×2): qty 2

## 2022-11-15 MED ORDER — ENOXAPARIN SODIUM 30 MG/0.3ML IJ SOSY
30.0000 mg | PREFILLED_SYRINGE | INTRAMUSCULAR | Status: DC
  Administered 2022-11-15 – 2022-11-16 (×2): 30 mg via SUBCUTANEOUS
  Filled 2022-11-15 (×2): qty 0.3

## 2022-11-15 MED ORDER — SODIUM CHLORIDE 0.9 % IV SOLN: INTRAVENOUS | Status: AC

## 2022-11-15 MED ORDER — PANTOPRAZOLE SODIUM 40 MG PO TBEC
40.0000 mg | DELAYED_RELEASE_TABLET | Freq: Every day | ORAL | Status: DC
  Administered 2022-11-15 – 2022-11-17 (×3): 40 mg via ORAL
  Filled 2022-11-15 (×3): qty 1

## 2022-11-15 NOTE — Assessment & Plan Note (Signed)
IV hydration Monitor renal function and avoid nephrotoxins

## 2022-11-15 NOTE — Assessment & Plan Note (Signed)
Suspect hypovolemic IV hydration with NS

## 2022-11-15 NOTE — Assessment & Plan Note (Addendum)
Possible sepsis Sepsis criteria include low-grade temperature, tachycardia, hypotension, AKI and infection, hyperbilirubinemia IV Rocephin, IV fluids, IV antiemetics and supportive care Pain control Follow cultures

## 2022-11-15 NOTE — Progress Notes (Signed)
  PROGRESS NOTE    Gail Carroll  [id number] DOB: November 11, 1945 DOA: 11/14/2022 PCP: Marguerita Merles, MD  219A/219A-AA  LOS: 0 days   Brief hospital course:   Assessment & Plan: Gail Carroll is a 77 y.o. female with medical history significant for Hypertension and diabetes who was sent from urgent care with abnormal labs.  Patient presented earlier with a 4-day history of nausea vomiting and generalized abdominal pain.  Labs done at urgent care showed sodium of 121, creatinine of 2, and Patient was subsequently sent to the ED for further workup.    * Acute pyelonephritis --CT showed Mild asymmetric right perinephric, parapelvic and periureteric inflammatory stranding.   --started on ceftriaxone Plan: --add on urine culture --cont ceftriaxone --cont MIVF@75$  for another 12 hours  Sepsis ruled out --pt didn't meet criteria  AKI (acute kidney injury) ([place]) --Cr 2.01 on presentation, improved to 1.65 the next day with IVF.  type 2 diabetes mellitus with hyperglycemia --SSI   Hyperbilirubinemia, mild Possibly related to sepsis  Hyponatremia Suspect hypovolemic --improved with IVF  Adrenal nodule, right (HCC) 23 mm indeterminate right adrenal nodule. Dedicated adrenal mass protocol CT or MRI examination is recommended for further evaluation.  Anemia Hemoglobin 10.8, no recent baseline on record  Hypertension Blood pressure was soft on admission  --hold home losartan and HCTZ   DVT prophylaxis: Lovenox SQ Code Status: Full code  Family Communication:  Level of care: Med-Surg Dispo:   The patient is from: home Anticipated d/c is to: home Anticipated d/c date is: 2-3 days   Subjective and Interval History:  Pt confirmed burning with urination.  Complained of whole-body ache and feeling weak.   Objective: Vitals:   11/15/22 0800 11/15/22 1100 11/15/22 1255 11/15/22 1534  BP: 132/60 127/76 (!) 121/53 124/62  Pulse: (!) 101 99 83 86  Resp: 20 12 20 20  $ Temp:  99.2 F (37.3 C) 99.2 F (37.3 C) 98.4 F (36.9 C) 98.2 F (36.8 C)  TempSrc:   Oral   SpO2: 97% 96% 100% 95%  Weight:      Height:        Intake/Output Summary (Last 24 hours) at 11/15/2022 1633 Last data filed at 11/15/2022 1418 Gross per 24 hour  Intake 1580 ml  Output --  Net 1580 ml   Filed Weights   11/14/22 2123  Weight: 55.3 kg    Examination:   Constitutional: NAD, AAOx3 HEENT: conjunctivae and lids normal, EOMI CV: No cyanosis.   RESP: normal respiratory effort, on RA Neuro: II - XII grossly intact.   Psych: depressed mood and affect.  Appropriate judgement and reason   Data Reviewed: I have personally reviewed labs and imaging studies  Time spent: 50 minutes  Enzo Bi, MD [facility] If 7PM-7AM, please contact night-coverage 11/15/2022, 4:33 PM

## 2022-11-15 NOTE — Assessment & Plan Note (Addendum)
Possibly related to sepsis Monitor response to hydration

## 2022-11-15 NOTE — Assessment & Plan Note (Signed)
Hemoglobin 10.8, no recent baseline on record

## 2022-11-15 NOTE — TOC Initial Note (Signed)
Transition of Care [facility]) - Initial/Assessment Note    Patient Details  Name: Gail Carroll MRN: [id number] Date of Birth: March 07, 1946  Transition of Care [facility]) CM/SW Contact:    Beverly Sessions, RN Phone Number: 11/15/2022, 3:19 PM  Clinical Narrative:                  Transition of Care [facility]) Screening Note   Patient Details  Name: Gail Carroll Date of Birth: 1945/11/09   Transition of Care [facility]) CM/SW Contact:    Beverly Sessions, RN Phone Number: 11/15/2022, 3:19 PM    Transition of Care Department [facility]) has reviewed patient and no TOC needs have been identified at this time. We will continue to monitor patient advancement through interdisciplinary progression rounds. If new patient transition needs arise, please place a TOC consult.          Patient Goals and CMS Choice            Expected Discharge Plan and Services                                              Prior Living Arrangements/Services                       Activities of Daily Living Home Assistive Devices/Equipment: None ADL Screening (condition at time of admission) Patient's cognitive ability adequate to safely complete daily activities?: Yes Is the patient deaf or have difficulty hearing?: No Does the patient have difficulty seeing, even when wearing glasses/contacts?: No Does the patient have difficulty concentrating, remembering, or making decisions?: No Patient able to express need for assistance with ADLs?: Yes Does the patient have difficulty dressing or bathing?: No Independently performs ADLs?: Yes (appropriate for developmental age) Does the patient have difficulty walking or climbing stairs?: No Weakness of Legs: None Weakness of Arms/Hands: None  Permission Sought/Granted                  Emotional Assessment              Admission diagnosis:  Hyponatremia [E87.1] Pyelonephritis [N12] Lower abdominal pain [R10.30] AKI (acute kidney  injury) ([place]) [N17.9] Acute pyelonephritis [N10] Urinary tract infection without hematuria, site unspecified [N39.0] Nausea and vomiting, unspecified vomiting type [R11.2] Patient Active Problem List   Diagnosis Date Noted   Acute pyelonephritis 11/15/2022   Hypertension 11/15/2022   AKI (acute kidney injury) ([place]) 11/15/2022   Uncontrolled type 2 diabetes mellitus with hyperglycemia, without long-term current use of insulin ([place]) 11/15/2022   Hyponatremia 11/15/2022   Anemia 11/15/2022   Hyperbilirubinemia 11/15/2022   Adrenal nodule, right ([place]) 11/15/2022   PCP:  Marguerita Merles, MD Pharmacy:   [place], [place] - [address] [place] [address] Phone: [phone] Fax: [phone]     Social Determinants of Health (SDOH) Social History: SDOH Screenings   Food Insecurity: No Food Insecurity (11/15/2022)  Housing: Low Risk  (11/15/2022)  Transportation Needs: No Transportation Needs (11/15/2022)  Utilities: Not At Risk (11/15/2022)  Tobacco Use: High Risk (11/15/2022)   SDOH Interventions:     Readmission Risk Interventions     No data to display

## 2022-11-15 NOTE — ED Notes (Signed)
Assumed care from [facility]. Pt resting comfortably in bed at this time. Pt denies any current needs or questions. Call light with in reach.

## 2022-11-15 NOTE — Assessment & Plan Note (Signed)
Sliding scale insulin coverage 

## 2022-11-15 NOTE — Assessment & Plan Note (Signed)
23 mm indeterminate right adrenal nodule. Dedicated adrenal mass protocol CT or MRI examination is recommended for further evaluation.

## 2022-11-15 NOTE — ED Notes (Signed)
Pt taken to CT.

## 2022-11-15 NOTE — Assessment & Plan Note (Signed)
Blood pressure was soft on admission so will hold home losartan and hydrochlorothiazide

## 2022-11-15 NOTE — ED Notes (Signed)
Pt cleaned of stool incontinence.

## 2022-11-15 NOTE — H&P (Signed)
History and Physical    Patient: Gail Carroll [id number] DOB: [birth year] DOA: 11/14/2022 DOS: the patient was seen and examined on 11/15/2022 PCP: Marguerita Merles, MD  Patient coming from: Home  Chief Complaint: No chief complaint on file.   HPI: Gail Carroll is a 77 y.o. female with medical history significant for Hypertension and diabetes who was sent from urgent care with abnormal labs.  Patient presented earlier with a 4-day history of nausea vomiting and generalized abdominal pain.  Temperature at urgent care was 99.8 and she was tachycardic to 107.  Labs done at urgent care showed negative respiratory viral panel for COVID RSV and influenza but showed sodium of [id number] metabolic acidosis, creatinine of 2 and an elevated bilirubin.  Patient was subsequently sent to the ED for further workup. ED course and data review:: Tmax 99.3 with pulse 104 BP [place] 109/59, fluid responsive to 130/61 O2 sat in the high 90s on room air.  Labs revealing WBC 8300, hemoglobin 10.8.  Creatinine 1.95 with bicarb 20, sodium 125, glucose 210, total bilirubin 2.0.  Urinalysis consistent with UTI with large leukocyte esterase and many bacteria.  EKG, personally viewed and interpreted shows sinus at 90 with no acute ST-T wave changes.  Chest x-ray showed no active disease CT abdomen and pelvis notable for acute pyelonephritis among other findings as outlined below: IMPRESSION: 1. Mild asymmetric right perinephric, parapelvic and periureteric inflammatory stranding in keeping with an asymmetric right inflammatory process as can be seen with ascending urinary tract infection/pyelonephritis. Correlation with urinalysis and urine culture may be helpful. No hydronephrosis. 2. Moderate right coronary artery calcification. 3. Severe sigmoid diverticulosis without superimposed acute inflammatory change. 4. 23 mm indeterminate right adrenal nodule. Dedicated adrenal mass protocol CT or MRI examination is recommended  for further evaluation. 5. Aortic atherosclerosis.  Patient was given an IV fluid bolus treated with Zofran started on Rocephin and hospitalist consulted for admission.   Review of Systems: As mentioned in the history of present illness. All other systems reviewed and are negative.  Past Medical History:  Diagnosis Date   GERD (gastroesophageal reflux disease)    Hypertension    Pre-diabetes    Past Surgical History:  Procedure Laterality Date   BACK SURGERY     c5-6   COLONOSCOPY N/A 03/02/2022   Procedure: COLONOSCOPY;  Surgeon: Annamaria Helling, DO;  Location: [facility] ENDOSCOPY;  Service: Gastroenterology;  Laterality: N/A;   DILATATION & CURETTAGE/HYSTEROSCOPY WITH MYOSURE N/A 12/27/2015   Procedure: DILATATION & CURETTAGE/HYSTEROSCOPY WITH MYOSURE/POLYPECTOMY;  Surgeon: Will Bonnet, MD;  Location: [facility] ORS;  Service: Gynecology;  Laterality: N/A;   TUBAL LIGATION     Social History:  reports that she has been smoking cigarettes. She has never used smokeless tobacco. She reports that she does not drink alcohol and does not use drugs.  Allergies  Allergen Reactions   Tea Other (See Comments)    Throat swells   Valium [Diazepam] Other (See Comments)    Very sensitive, small dose makes her incoherent    History reviewed. No pertinent family history.  Prior to Admission medications   Medication Sig Start Date End Date Taking? Authorizing Provider  acetaminophen (TYLENOL) 325 MG tablet Take 650 mg by mouth every morning.   Yes [provider]  amLODipine (NORVASC) 10 MG tablet Take 10 mg by mouth daily.   Yes [provider]  aspirin 81 MG tablet Take 81 mg by mouth daily.   Yes [provider]  Choline  Fenofibrate (FENOFIBRIC ACID PO) Take 45 mg by mouth daily at 6 (six) AM.   Yes [provider]  hydrochlorothiazide (MICROZIDE) 12.5 MG capsule Take 12.5 mg by mouth daily. 08/14/22  Yes [provider]  ibuprofen (ADVIL)  800 MG tablet Take 800 mg by mouth 3 (three) times daily. 11/12/22  Yes [provider]  linagliptin (TRADJENTA) 5 MG TABS tablet Take 5 mg by mouth daily.   Yes [provider]  losartan (COZAAR) 50 MG tablet Take 50 mg by mouth daily.   Yes [provider]  lovastatin (MEVACOR) 40 MG tablet Take by mouth every morning.   Yes [provider]  omeprazole (PRILOSEC) 40 MG capsule Take 40 mg by mouth daily.   Yes [provider]  diphenhydramine-acetaminophen (TYLENOL PM) 25-500 MG TABS tablet Take 1 tablet by mouth at bedtime as needed.    [provider]  ibuprofen (ADVIL,MOTRIN) 600 MG tablet Take 1 tablet (600 mg total) by mouth every 6 (six) hours as needed for mild pain or cramping. Patient not taking: Reported on 11/15/2022 12/27/15   Will Bonnet, MD  [employer] MED[employer] cold and allergy    [provider]    Physical Exam: Vitals:   11/14/22 2125 11/14/22 2253 11/15/22 0005 11/15/22 0205  BP: (!) 111/58 (!) 109/59 124/61 130/61  Pulse: 97 94 89 (!) 104  Resp: '18 14 16 '$ (!) 21  Temp: 98.9 F (37.2 C)   99.3 F (37.4 C)  TempSrc: Oral   Oral  SpO2: 100% 95% 96% 97%  Weight:      Height:       Physical Exam Vitals and nursing note reviewed.  Constitutional:      General: She is not in acute distress.    Appearance: She is ill-appearing.  HENT:     Head: Normocephalic and atraumatic.  Cardiovascular:     Rate and Rhythm: Regular rhythm. Tachycardia present.     Heart sounds: Normal heart sounds.  Pulmonary:     Effort: Pulmonary effort is normal.     Breath sounds: Normal breath sounds.  Abdominal:     Palpations: Abdomen is soft.     Tenderness: There is no abdominal tenderness.  Neurological:     Mental Status: Mental status is at baseline.     Labs on Admission: I have personally reviewed following labs and imaging studies  CBC: Recent Labs  Lab 11/14/22 1933 11/14/22 2159   WBC 10.0 8.3  NEUTROABS 8.5* 7.1  HGB 11.2* 10.8*  HCT 32.3* 32.3*  MCV 83.9 84.6  PLT 242 [id number]   Basic Metabolic Panel: Recent Labs  Lab 11/14/22 1933 11/14/22 2159  NA 121* 125*  K 4.1 4.0  CL 90* 92*  CO2 18* 20*  GLUCOSE 221* 210*  BUN 54* 54*  CREATININE 2.01* 1.95*  CALCIUM 7.8* 8.2*   GFR: Estimated Creatinine Clearance: 21.8 mL/min (A) (by C-G formula based on SCr of 1.95 mg/dL (H)). Liver Function Tests: Recent Labs  Lab 11/14/22 1933 11/14/22 2159  AST 29 29  ALT 19 18  ALKPHOS 90 88  BILITOT 1.7* 2.0*  PROT 7.1 7.0  ALBUMIN 2.9* 2.9*   No results for input(s): "LIPASE", "AMYLASE" in the last 168 hours. No results for input(s): "AMMONIA" in the last 168 hours. Coagulation Profile: No results for input(s): "INR", "PROTIME" in the last 168 hours. Cardiac Enzymes: No results for input(s): "CKTOTAL", "CKMB", "CKMBINDEX", "TROPONINI" in the last 168 hours. BNP (  last 3 results) No results for input(s): "PROBNP" in the last 8760 hours. HbA1C: No results for input(s): "HGBA1C" in the last 72 hours. CBG: No results for input(s): "GLUCAP" in the last 168 hours. Lipid Profile: No results for input(s): "CHOL", "HDL", "LDLCALC", "TRIG", "CHOLHDL", "LDLDIRECT" in the last 72 hours. Thyroid Function Tests: No results for input(s): "TSH", "T4TOTAL", "FREET4", "T3FREE", "THYROIDAB" in the last 72 hours. Anemia Panel: No results for input(s): "VITAMINB12", "FOLATE", "FERRITIN", "TIBC", "IRON", "RETICCTPCT" in the last 72 hours. Urine analysis:    Component Value Date/Time   COLORURINE YELLOW (A) 11/15/2022 0206   APPEARANCEUR CLOUDY (A) 11/15/2022 0206   LABSPEC 1.009 11/15/2022 0206   PHURINE 5.0 11/15/2022 0206   GLUCOSEU NEGATIVE 11/15/2022 0206   HGBUR SMALL (A) 11/15/2022 0206   BILIRUBINUR NEGATIVE 11/15/2022 0206   KETONESUR NEGATIVE 11/15/2022 0206   PROTEINUR 30 (A) 11/15/2022 0206   NITRITE NEGATIVE 11/15/2022 0206   LEUKOCYTESUR LARGE (A)  11/15/2022 0206    Radiological Exams on Admission: DG Chest Port 1 View  Result Date: 11/15/2022 CLINICAL DATA:  Shortness of breath, fatigue, cough, and weakness EXAM: PORTABLE CHEST 1 VIEW COMPARISON:  05/01/2018 FINDINGS: The heart size and mediastinal contours are within normal limits. Aortic atherosclerotic calcification. Both lungs are clear. The visualized skeletal structures are unremarkable. IMPRESSION: No active disease. Electronically Signed   By: Placido Sou M.D.   On: 11/15/2022 00:31   CT ABDOMEN PELVIS WO CONTRAST  Result Date: 11/15/2022 CLINICAL DATA:  Bilateral lower quadrant abdominal pain EXAM: CT ABDOMEN AND PELVIS WITHOUT CONTRAST TECHNIQUE: Multidetector CT imaging of the abdomen and pelvis was performed following the standard protocol without IV contrast. RADIATION DOSE REDUCTION: This exam was performed according to the departmental dose-optimization program which includes automated exposure control, adjustment of the mA and/or kV according to patient size and/or use of iterative reconstruction technique. COMPARISON:  None Available. FINDINGS: Lower chest: No acute abnormality. Moderate right coronary artery calcification. Hepatobiliary: No focal liver abnormality is seen. No gallstones, gallbladder wall thickening, or biliary dilatation. Pancreas: Unremarkable. No pancreatic ductal dilatation or surrounding inflammatory changes. Spleen: Normal in size without focal abnormality. Adrenals/Urinary Tract: 13 mm x 23 mm right adrenal nodule is indeterminate measuring 40 Hounsfield units in density. Left adrenal gland is unremarkable. The kidneys are normal in size and position. Multiple calcifications are seen within the renal hila bilaterally which I suspect represent vascular calcifications. No definite renal urinary or ureteral calculi identified. There is mild asymmetric right perinephric, parapelvic and periureteric inflammatory stranding in keeping with an asymmetric right  inflammatory process as can be seen with pyelonephritis. No hydronephrosis. The bladder is unremarkable. Stomach/Bowel: Severe sigmoid diverticulosis without superimposed acute inflammatory change. The stomach, small bowel, and large bowel are otherwise unremarkable. No evidence of obstruction or focal inflammation. The appendix is not clearly identified and may be absent. No free intraperitoneal gas or fluid. Vascular/Lymphatic: Aortic atherosclerosis. No enlarged abdominal or pelvic lymph nodes. Reproductive: Uterus and bilateral adnexa are unremarkable. Other: Abdominal wall hernia. Musculoskeletal: No acute bone abnormality. No lytic or blastic bone lesion. IMPRESSION: 1. Mild asymmetric right perinephric, parapelvic and periureteric inflammatory stranding in keeping with an asymmetric right inflammatory process as can be seen with ascending urinary tract infection/pyelonephritis. Correlation with urinalysis and urine culture may be helpful. No hydronephrosis. 2. Moderate right coronary artery calcification. 3. Severe sigmoid diverticulosis without superimposed acute inflammatory change. 4. 23 mm indeterminate right adrenal nodule. Dedicated adrenal mass protocol CT or MRI examination is recommended for further  evaluation. 5. Aortic atherosclerosis. Aortic Atherosclerosis (ICD10-I70.0). Electronically Signed   By: Fidela Salisbury M.D.   On: 11/15/2022 00:31     Data Reviewed: Relevant notes from primary care and specialist visits, past discharge summaries as available in EHR, including Care Everywhere. Prior diagnostic testing as pertinent to current admission diagnoses Updated medications and problem lists for reconciliation ED course, including vitals, labs, imaging, treatment and response to treatment Triage notes, nursing and pharmacy notes and ED provider's notes Notable results as noted in HPI   Assessment and Plan: * Acute pyelonephritis Possible sepsis Sepsis criteria include low-grade  temperature, tachycardia, hypotension, AKI and infection, hyperbilirubinemia IV Rocephin, IV fluids, IV antiemetics and supportive care Pain control Follow cultures  AKI (acute kidney injury) ([place]) IV hydration Monitor renal function and avoid nephrotoxins  Uncontrolled type 2 diabetes mellitus with hyperglycemia, without long-term current use of insulin (HCC) Sliding scale insulin coverage  Hyperbilirubinemia Possibly related to sepsis Monitor response to hydration  Hyponatremia Suspect hypovolemic IV hydration with NS  Adrenal nodule, right (HCC) 23 mm indeterminate right adrenal nodule. Dedicated adrenal mass protocol CT or MRI examination is recommended for further evaluation.  Anemia Hemoglobin 10.8, no recent baseline on record  Hypertension Blood pressure was soft on admission so will hold home losartan and hydrochlorothiazide        DVT prophylaxis: Lovenox  Consults: none  Advance Care Planning: full code  Family Communication: none  Disposition Plan: Back to previous home environment  Severity of Illness: The appropriate patient status for this patient is INPATIENT. Inpatient status is judged to be reasonable and necessary in order to provide the required intensity of service to ensure the patient's safety. The patient's presenting symptoms, physical exam findings, and initial radiographic and laboratory data in the context of their chronic comorbidities is felt to place them at high risk for further clinical deterioration. Furthermore, it is not anticipated that the patient will be medically stable for discharge from the hospital within 2 midnights of admission.   * I certify that at the point of admission it is my clinical judgment that the patient will require inpatient hospital care spanning beyond 2 midnights from the point of admission due to high intensity of service, high risk for further deterioration and high frequency of surveillance  required.*  Author: Athena Masse, MD 11/15/2022 3:15 AM  For on call review [email or web address].

## 2022-11-15 NOTE — ED Notes (Signed)
Pain assessed on wrong patient

## 2022-11-15 NOTE — Progress Notes (Signed)
Anticoagulation monitoring(Lovenox):  77 yo female ordered Lovenox 40 mg Q24h    Filed Weights   11/14/22 2123  Weight: 55.3 kg (122 lb)   BMI 19.1    Lab Results  Component Value Date   CREATININE 1.95 (H) 11/14/2022   CREATININE 2.01 (H) 11/14/2022   CREATININE 1.04 (H) 12/19/2015   Estimated Creatinine Clearance: 21.8 mL/min (A) (by C-G formula based on SCr of 1.95 mg/dL (H)). Hemoglobin & Hematocrit     Component Value Date/Time   HGB 10.8 (L) 11/14/2022 2159   HCT 32.3 (L) 11/14/2022 2159     Per Protocol for Patient with estCrcl < 30 ml/min and BMI < 30, will transition to Lovenox 30 mg Q24h.

## 2022-11-16 DIAGNOSIS — N10 Acute tubulo-interstitial nephritis: Secondary | ICD-10-CM | POA: Diagnosis not present

## 2022-11-16 LAB — CBC
HCT: 33 % — ABNORMAL LOW (ref 36.0–46.0)
Hemoglobin: 11.1 g/dL — ABNORMAL LOW (ref 12.0–15.0)
MCH: 28.2 pg (ref 26.0–34.0)
MCHC: 33.6 g/dL (ref 30.0–36.0)
MCV: 84 fL (ref 80.0–100.0)
Platelets: 266 10*3/uL (ref 150–400)
RBC: 3.93 MIL/uL (ref 3.87–5.11)
RDW: 12.8 % (ref 11.5–15.5)
WBC: 6.5 10*3/uL (ref 4.0–10.5)
nRBC: 0 % (ref 0.0–0.2)

## 2022-11-16 LAB — BASIC METABOLIC PANEL
Anion gap: 12 (ref 5–15)
BUN: 29 mg/dL — ABNORMAL HIGH (ref 8–23)
CO2: 23 mmol/L (ref 22–32)
Calcium: 8.5 mg/dL — ABNORMAL LOW (ref 8.9–10.3)
Chloride: 98 mmol/L (ref 98–111)
Creatinine, Ser: 1.22 mg/dL — ABNORMAL HIGH (ref 0.44–1.00)
GFR, Estimated: 46 mL/min — ABNORMAL LOW (ref >60)
Glucose, Bld: 182 mg/dL — ABNORMAL HIGH (ref 70–99)
Potassium: 4 mmol/L (ref 3.5–5.1)
Sodium: 133 mmol/L — ABNORMAL LOW (ref 135–145)

## 2022-11-16 LAB — MAGNESIUM: Magnesium: 1.9 mg/dL (ref 1.7–2.4)

## 2022-11-16 LAB — GLUCOSE, CAPILLARY
Glucose-Capillary: 176 mg/dL — ABNORMAL HIGH (ref 70–99)
Glucose-Capillary: 151 mg/dL — ABNORMAL HIGH (ref 70–99)
Glucose-Capillary: 183 mg/dL — ABNORMAL HIGH (ref 70–99)
Glucose-Capillary: 119 mg/dL — ABNORMAL HIGH (ref 70–99)

## 2022-11-16 MED ORDER — ENOXAPARIN SODIUM 40 MG/0.4ML IJ SOSY
40.0000 mg | PREFILLED_SYRINGE | INTRAMUSCULAR | Status: DC
  Administered 2022-11-17: 40 mg via SUBCUTANEOUS
  Filled 2022-11-16: qty 0.4

## 2022-11-16 NOTE — Progress Notes (Signed)
Anticoagulation monitoring(Lovenox):  77 yo female ordered Lovenox 30 mg Q24h    Filed Weights   11/14/22 2123  Weight: 55.3 kg (122 lb)   BMI 19.1    Lab Results  Component Value Date   CREATININE 1.22 (H) 11/16/2022   CREATININE 1.65 (H) 11/15/2022   CREATININE 1.95 (H) 11/14/2022   Estimated Creatinine Clearance: 34.8 mL/min (A) (by C-G formula based on SCr of 1.22 mg/dL (H)). Hemoglobin & Hematocrit     Component Value Date/Time   HGB 11.1 (L) 11/16/2022 0430   HCT 33.0 (L) 11/16/2022 0430     Per Protocol for Patient with estCrcl now > 30 ml/min and BMI < 30, will transition to Lovenox 40 mg Q24h.     Chinita Greenland PharmD Clinical Pharmacist 11/16/2022

## 2022-11-16 NOTE — Progress Notes (Signed)
  PROGRESS NOTE    Gail Carroll  [id number] DOB: [birth year] DOA: 11/14/2022 PCP: Marguerita Merles, MD  [id number]  LOS: 1 day   Brief hospital course:   Assessment & Plan: Gail Carroll is a 77 y.o. female with medical history significant for Hypertension and diabetes who was sent from urgent care with abnormal labs.  Patient presented earlier with a 4-day history of nausea vomiting and generalized abdominal pain.  Labs done at urgent care showed sodium of 121, creatinine of 2, and Patient was subsequently sent to the ED for further workup.    * Acute pyelonephritis --CT showed Mild asymmetric right perinephric, parapelvic and periureteric inflammatory stranding.   --started on ceftriaxone --added on urine cx Plan: --cont ceftriaxone pending urine cx  Sepsis ruled out --pt didn't meet criteria  AKI (acute kidney injury) ([place]) --Cr 2.01 on presentation, improved to 1.65 the next day with IVF. --oral hydration now  type 2 diabetes mellitus well controlled with hyperglycemia --SSI for now  Hyperbilirubinemia, mild Possibly related to sepsis  Hyponatremia Suspect hypovolemic --improved with IVF  Adrenal nodule, right (HCC) 23 mm indeterminate right adrenal nodule. Dedicated adrenal mass protocol CT or MRI examination is recommended for further evaluation.  Anemia Hemoglobin 10.8, no recent baseline on record  Hypertension Blood pressure was soft on admission  --hold home losartan and HCTZ   DVT prophylaxis: Lovenox SQ Code Status: Full code  Family Communication:  Level of care: Med-Surg Dispo:   The patient is from: home Anticipated d/c is to: home Anticipated d/c date is: likely tomorrow  Subjective and Interval History:  Pt reported feeling much better today.     Objective: Vitals:   11/15/22 2004 11/16/22 0413 11/16/22 0749 11/16/22 1707  BP: 126/63 133/63 131/70 135/69  Pulse: 91 95 91 85  Resp: '18 20 18 18  '$ Temp: 98.6 F (37 C) 98.4 F  (36.9 C) 98.6 F (37 C) 98.6 F (37 C)  TempSrc: Oral Oral Oral Oral  SpO2: 96% 93% 97% 97%  Weight:      Height:        Intake/Output Summary (Last 24 hours) at 11/16/2022 1946 Last data filed at 11/16/2022 1513 Gross per 24 hour  Intake 220 ml  Output 1250 ml  Net -1030 ml   Filed Weights   11/14/22 2123  Weight: 55.3 kg    Examination:   Constitutional: NAD, AAOx3 HEENT: conjunctivae and lids normal, EOMI CV: No cyanosis.   RESP: normal respiratory effort, on RA Neuro: II - XII grossly intact.   Psych: Normal mood and affect.  Appropriate judgement and reason   Data Reviewed: I have personally reviewed labs and imaging studies  Time spent: 35 minutes  Enzo Bi, MD [facility] If 7PM-7AM, please contact night-coverage 11/16/2022, 7:46 PM

## 2022-11-16 NOTE — Progress Notes (Signed)
Mobility Specialist - Progress Note   11/16/22 1023  Mobility  Activity Ambulated independently in hallway  Level of Assistance Modified independent, requires aide device or extra time  Assistive Device None  Distance Ambulated (ft) 180 ft  Activity Response Tolerated well  Mobility Referral Yes  $Mobility charge 1 Mobility   Pt supine upon entry, utilizing RA. Pt completed bed mob and STS indep. Pt amb one lap around the NS ModI. Pt shows a tendency to hold onto railing while amb, voices she holds on the railing for safety and security. Pt returned to the room, left supine with needs within reach.   Candie Mile Mobility Specialist 11/16/22 10:27 AM

## 2022-11-17 DIAGNOSIS — N10 Acute tubulo-interstitial nephritis: Secondary | ICD-10-CM | POA: Diagnosis not present

## 2022-11-17 LAB — BASIC METABOLIC PANEL
Anion gap: 11 (ref 5–15)
BUN: 19 mg/dL (ref 8–23)
CO2: 23 mmol/L (ref 22–32)
Calcium: 8.3 mg/dL — ABNORMAL LOW (ref 8.9–10.3)
Chloride: 96 mmol/L — ABNORMAL LOW (ref 98–111)
Creatinine, Ser: 1.12 mg/dL — ABNORMAL HIGH (ref 0.44–1.00)
GFR, Estimated: 51 mL/min — ABNORMAL LOW (ref >60)
Glucose, Bld: 150 mg/dL — ABNORMAL HIGH (ref 70–99)
Potassium: 3.9 mmol/L (ref 3.5–5.1)
Sodium: 130 mmol/L — ABNORMAL LOW (ref 135–145)

## 2022-11-17 LAB — URINE CULTURE: Culture: >=100000 — AB

## 2022-11-17 LAB — CBC
HCT: 27.2 % — ABNORMAL LOW (ref 36.0–46.0)
Hemoglobin: 9.5 g/dL — ABNORMAL LOW (ref 12.0–15.0)
MCH: 29.1 pg (ref 26.0–34.0)
MCHC: 34.9 g/dL (ref 30.0–36.0)
MCV: 83.2 fL (ref 80.0–100.0)
Platelets: 249 10*3/uL (ref 150–400)
RBC: 3.27 MIL/uL — ABNORMAL LOW (ref 3.87–5.11)
RDW: 12.6 % (ref 11.5–15.5)
WBC: 7.6 10*3/uL (ref 4.0–10.5)
nRBC: 0 % (ref 0.0–0.2)

## 2022-11-17 LAB — GLUCOSE, CAPILLARY
Glucose-Capillary: 147 mg/dL — ABNORMAL HIGH (ref 70–99)
Glucose-Capillary: 174 mg/dL — ABNORMAL HIGH (ref 70–99)

## 2022-11-17 LAB — MAGNESIUM: Magnesium: 1.7 mg/dL (ref 1.7–2.4)

## 2022-11-17 MED ORDER — LOSARTAN POTASSIUM 50 MG PO TABS: ORAL_TABLET | ORAL | Status: AC

## 2022-11-17 MED ORDER — CIPROFLOXACIN HCL 500 MG PO TABS: 500.0000 mg | ORAL_TABLET | Freq: Two times a day (BID) | ORAL | 0 refills | Status: AC

## 2022-11-17 MED ORDER — HYDROCHLOROTHIAZIDE 12.5 MG PO CAPS: ORAL_CAPSULE | ORAL | Status: AC

## 2022-11-17 MED ORDER — AMLODIPINE BESYLATE 10 MG PO TABS: ORAL_TABLET | ORAL | Status: AC

## 2022-11-17 MED ORDER — SODIUM CHLORIDE 0.9 % IV SOLN: INTRAVENOUS | Status: DC | PRN

## 2022-11-17 NOTE — Discharge Summary (Signed)
Physician Discharge Summary   Gail Carroll  female DOB: 01-Jan-1946  [id number]  PCP: Marguerita Merles, MD  Admit date: 11/14/2022 Discharge date: 11/17/2022  Admitted From: home Disposition:  home CODE STATUS: Full code  Discharge Instructions     Discharge instructions   Complete by: As directed    You have received 3 days of IV antibiotic for your kidney infection.  Please finish 4 more days of oral antibiotic Cipro starting tonight.   Dr. Enzo Bi [facility] Course:  For full details, please see H&P, progress notes, consult notes and ancillary notes.  Briefly,  Gail Carroll is a 77 y.o. female with medical history significant for Hypertension and diabetes who was sent from urgent care with abnormal labs.  Patient presented earlier with a 4-day history of nausea vomiting and generalized abdominal pain.  Labs done at urgent care showed sodium of 121, creatinine of 2, and Patient was subsequently sent to the ED for further workup.    * Acute pyelonephritis UTI 2/2 E coli --CT showed Mild asymmetric right perinephric, parapelvic and periureteric inflammatory stranding.  Pt reported dysuria.  Pt was started on ceftriaxone on admission with much improvement in symptoms after 2 days. --pt received 3 days of ceftriaxone and was discharged on 4 more days of Cipro.  Sepsis ruled out --pt didn't meet criteria   AKI (acute kidney injury) ([place]) --Cr 2.01 on presentation, improved to 1.65 the next day with IVF.  Cr 1.12 prior to discharge.   type 2 diabetes mellitus well controlled with hyperglycemia --A1c 6.6. --home linagliptin resumed after discharge.   Hyperbilirubinemia, mild Possibly related to sepsis   Hyponatremia Suspect hypovolemic --improved with IVF   Adrenal nodule, right (HCC) 23 mm indeterminate right adrenal nodule. Dedicated adrenal mass protocol CT or MRI examination is recommended for further evaluation.   Anemia Hemoglobin 9's-11's, no  recent baseline on record   Hypertension Blood pressure was soft on admission  --hold home amlodipine, losartan and HCTZ pending PCP f/u.    Discharge Diagnoses:  Principal Problem:   Acute pyelonephritis Active Problems:   AKI (acute kidney injury) ([place])   Uncontrolled type 2 diabetes mellitus with hyperglycemia, without long-term current use of insulin (HCC)   Hyperbilirubinemia   Hyponatremia   Hypertension   Anemia   Adrenal nodule, right ([place])   30 Day Unplanned Readmission Risk Score    Flowsheet Row ED to Hosp-Admission (Current) from 11/14/2022 in [facility]  30 Day Unplanned Readmission Risk Score (%) 12.25 Filed at 11/17/2022 1200       This score is the patient's risk of an unplanned readmission within 30 days of being discharged (0 -100%). The score is based on dignosis, age, lab data, medications, orders, and past utilization.   Low:  0-14.9   Medium: 15-21.9   High: 22-29.9   Extreme: 30 and above         Discharge Instructions:  Allergies as of 11/17/2022       Reactions   Tea Other (See Comments)   Throat swells   Valium [diazepam] Other (See Comments)   Very sensitive, small dose makes her incoherent        Medication List     STOP taking these medications    ibuprofen 600 MG tablet Commonly known as: ADVIL   ibuprofen 800 MG tablet Commonly known as: ADVIL       TAKE these medications  acetaminophen 325 MG tablet Commonly known as: TYLENOL Take 650 mg by mouth every morning.   amLODipine 10 MG tablet Commonly known as: NORVASC Hold until outpatient followup due to low normal blood pressure. What changed:  how much to take how to take this when to take this additional instructions   aspirin 81 MG tablet Take 81 mg by mouth daily.   ciprofloxacin 500 MG tablet Commonly known as: Cipro Take 1 tablet (500 mg total) by mouth 2 (two) times daily for 4 days.    diphenhydramine-acetaminophen 25-500 MG Tabs tablet Commonly known as: TYLENOL PM Take 1 tablet by mouth at bedtime as needed.   FENOFIBRIC ACID PO Take 45 mg by mouth daily at 6 (six) AM.   hydrochlorothiazide 12.5 MG capsule Commonly known as: MICROZIDE Hold until outpatient followup due to low normal blood pressure. What changed:  how much to take how to take this when to take this additional instructions   linagliptin 5 MG Tabs tablet Commonly known as: TRADJENTA Take 5 mg by mouth daily.   losartan 50 MG tablet Commonly known as: COZAAR Hold until outpatient followup due to low normal blood pressure. What changed:  how much to take how to take this when to take this additional instructions   lovastatin 40 MG tablet Commonly known as: MEVACOR Take by mouth every morning.   omeprazole 40 MG capsule Commonly known as: PRILOSEC Take 40 mg by mouth daily.   OVER THE COUNTER MEDICATION [employer] cold and allergy         Follow-up Information     Marguerita Merles, MD Follow up in 1 week(s).   Specialty: Family Medicine Contact information: [place] [address] [phone]                 Allergies  Allergen Reactions   Tea Other (See Comments)    Throat swells   Valium [Diazepam] Other (See Comments)    Very sensitive, small dose makes her incoherent     The results of significant diagnostics from this hospitalization (including imaging, microbiology, ancillary and laboratory) are listed below for reference.   Consultations:   Procedures/Studies: DG Chest Port 1 View  Result Date: 11/15/2022 CLINICAL DATA:  Shortness of breath, fatigue, cough, and weakness EXAM: PORTABLE CHEST 1 VIEW COMPARISON:  05/01/2018 FINDINGS: The heart size and mediastinal contours are within normal limits. Aortic atherosclerotic calcification. Both lungs are clear. The visualized skeletal structures are unremarkable. IMPRESSION: No active  disease. Electronically Signed   By: Placido Sou M.D.   On: 11/15/2022 00:31   CT ABDOMEN PELVIS WO CONTRAST  Result Date: 11/15/2022 CLINICAL DATA:  Bilateral lower quadrant abdominal pain EXAM: CT ABDOMEN AND PELVIS WITHOUT CONTRAST TECHNIQUE: Multidetector CT imaging of the abdomen and pelvis was performed following the standard protocol without IV contrast. RADIATION DOSE REDUCTION: This exam was performed according to the departmental dose-optimization program which includes automated exposure control, adjustment of the mA and/or kV according to patient size and/or use of iterative reconstruction technique. COMPARISON:  None Available. FINDINGS: Lower chest: No acute abnormality. Moderate right coronary artery calcification. Hepatobiliary: No focal liver abnormality is seen. No gallstones, gallbladder wall thickening, or biliary dilatation. Pancreas: Unremarkable. No pancreatic ductal dilatation or surrounding inflammatory changes. Spleen: Normal in size without focal abnormality. Adrenals/Urinary Tract: 13 mm x 23 mm right adrenal nodule is indeterminate measuring 40 Hounsfield units in density. Left adrenal gland is unremarkable. The kidneys are normal in size and position.  Multiple calcifications are seen within the renal hila bilaterally which I suspect represent vascular calcifications. No definite renal urinary or ureteral calculi identified. There is mild asymmetric right perinephric, parapelvic and periureteric inflammatory stranding in keeping with an asymmetric right inflammatory process as can be seen with pyelonephritis. No hydronephrosis. The bladder is unremarkable. Stomach/Bowel: Severe sigmoid diverticulosis without superimposed acute inflammatory change. The stomach, small bowel, and large bowel are otherwise unremarkable. No evidence of obstruction or focal inflammation. The appendix is not clearly identified and may be absent. No free intraperitoneal gas or fluid. Vascular/Lymphatic:  Aortic atherosclerosis. No enlarged abdominal or pelvic lymph nodes. Reproductive: Uterus and bilateral adnexa are unremarkable. Other: Abdominal wall hernia. Musculoskeletal: No acute bone abnormality. No lytic or blastic bone lesion. IMPRESSION: 1. Mild asymmetric right perinephric, parapelvic and periureteric inflammatory stranding in keeping with an asymmetric right inflammatory process as can be seen with ascending urinary tract infection/pyelonephritis. Correlation with urinalysis and urine culture may be helpful. No hydronephrosis. 2. Moderate right coronary artery calcification. 3. Severe sigmoid diverticulosis without superimposed acute inflammatory change. 4. 23 mm indeterminate right adrenal nodule. Dedicated adrenal mass protocol CT or MRI examination is recommended for further evaluation. 5. Aortic atherosclerosis. Aortic Atherosclerosis (ICD10-I70.0). Electronically Signed   By: Fidela Salisbury M.D.   On: 11/15/2022 00:31   VAS [place] ABI WITH/WO TBI  Result Date: 10/26/2022  LOWER EXTREMITY DOPPLER STUDY Patient Name:  ROGERS MESSAMORE  Date of Exam:   10/24/2022 Medical Rec #: [id number]       Accession #:    [id number] Date of Birth: May 05, 1946       Patient Gender: F Patient Age:   18 years Exam Location:  [facility] Procedure:      VAS [place] ABI WITH/WO TBI Referring Phys: Eulogio Ditch --------------------------------------------------------------------------------  Indications: LLE claudication  Performing Technologist: Blondell Reveal RT, RDMS, RVT  Examination Guidelines: A complete evaluation includes at minimum, Doppler waveform signals and systolic blood pressure reading at the level of bilateral brachial, anterior tibial, and posterior tibial arteries, when vessel segments are accessible. Bilateral testing is considered an integral part of a complete examination. Photoelectric Plethysmograph (PPG) waveforms and toe systolic pressure readings are included as required and additional duplex  testing as needed. Limited examinations for reoccurring indications may be performed as noted.  ABI Findings: +---------+------------------+-----+---------+--------+ Right    Rt Pressure (mmHg)IndexWaveform Comment  +---------+------------------+-----+---------+--------+ Brachial 128                                      +---------+------------------+-----+---------+--------+ PTA      152               1.18 triphasic         +---------+------------------+-----+---------+--------+ DP       140               1.09 triphasic         +---------+------------------+-----+---------+--------+ Great Toe101               0.78 Normal            +---------+------------------+-----+---------+--------+ +---------+------------------+-----+--------+-------+ Left     Lt Pressure (mmHg)IndexWaveformComment +---------+------------------+-----+--------+-------+ Brachial 129                                    +---------+------------------+-----+--------+-------+ PTA  116               0.90 biphasic        +---------+------------------+-----+--------+-------+ DP       113               0.88 biphasic        +---------+------------------+-----+--------+-------+ Great Toe82                0.64 Normal          +---------+------------------+-----+--------+-------+  Summary: Right: Resting right ankle-brachial index is within normal range. The right toe-brachial index is normal. 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 The left toe-brachial index is mildly abnormal. *See table(s) above for measurements and observations. Electronically signed by Hortencia Pilar MD on 10/26/2022 at 11:05:54 AM.    Final       Labs: BNP (last 3 results) No results for input(s): "BNP" in the last 8760 hours. Basic Metabolic Panel: Recent Labs  Lab 11/14/22 1933 11/14/22 2159 11/15/22 0426 11/16/22 0430 11/17/22 0435  NA 121* 125* 130*  133* 130*  K 4.1 4.0 4.0 4.0 3.9  CL 90* 92* 97* 98 96*  CO2 18* 20* 20* 23 23  GLUCOSE 221* 210* 165* 182* 150*  BUN 54* 54* 48* 29* 19  CREATININE 2.01* 1.95* 1.65* 1.22* 1.12*  CALCIUM 7.8* 8.2* 8.0* 8.5* 8.3*  MG  --   --   --  1.9 1.7   Liver Function Tests: Recent Labs  Lab 11/14/22 1933 11/14/22 2159 11/15/22 0426  AST '29 29 26  '$ ALT '19 18 17  '$ ALKPHOS 90 88 81  BILITOT 1.7* 2.0* 1.5*  PROT 7.1 7.0 6.7  ALBUMIN 2.9* 2.9* 2.7*   No results for input(s): "LIPASE", "AMYLASE" in the last 168 hours. No results for input(s): "AMMONIA" in the last 168 hours. CBC: Recent Labs  Lab 11/14/22 1933 11/14/22 2159 11/16/22 0430 11/17/22 0435  WBC 10.0 8.3 6.5 7.6  NEUTROABS 8.5* 7.1  --   --   HGB 11.2* 10.8* 11.1* 9.5*  HCT 32.3* 32.3* 33.0* 27.2*  MCV 83.9 84.6 84.0 83.2  PLT 242 231 266 249   Cardiac Enzymes: No results for input(s): "CKTOTAL", "CKMB", "CKMBINDEX", "TROPONINI" in the last 168 hours. BNP: Invalid input(s): "POCBNP" CBG: Recent Labs  Lab 11/16/22 1219 11/16/22 1704 11/16/22 2104 11/17/22 0746 11/17/22 1142  GLUCAP 151* 183* 119* 147* 174*   D-Dimer No results for input(s): "DDIMER" in the last 72 hours. Hgb A1c Recent Labs    11/15/22 0426  HGBA1C 6.6*   Lipid Profile No results for input(s): "CHOL", "HDL", "LDLCALC", "TRIG", "CHOLHDL", "LDLDIRECT" in the last 72 hours. Thyroid function studies No results for input(s): "TSH", "T4TOTAL", "T3FREE", "THYROIDAB" in the last 72 hours.  Invalid input(s): "FREET3" Anemia work up No results for input(s): "VITAMINB12", "FOLATE", "FERRITIN", "TIBC", "IRON", "RETICCTPCT" in the last 72 hours. Urinalysis    Component Value Date/Time   COLORURINE YELLOW (A) 11/15/2022 0206   APPEARANCEUR CLOUDY (A) 11/15/2022 0206   LABSPEC 1.009 11/15/2022 0206   PHURINE 5.0 11/15/2022 0206   GLUCOSEU NEGATIVE 11/15/2022 0206   HGBUR SMALL (A) 11/15/2022 0206   BILIRUBINUR NEGATIVE 11/15/2022 0206    KETONESUR NEGATIVE 11/15/2022 0206   PROTEINUR 30 (A) 11/15/2022 0206   NITRITE NEGATIVE 11/15/2022 0206   LEUKOCYTESUR LARGE (A) 11/15/2022 0206   Sepsis Labs Recent Labs  Lab 11/14/22 1933 11/14/22 2159 11/16/22 0430 11/17/22 0435  WBC 10.0 8.3 6.5 7.6  Microbiology Recent Results (from the past 240 hour(s))  Resp panel by RT-PCR (RSV, Flu A&B, Covid) Anterior Nasal Swab     Status: None   Collection Time: 11/14/22  6:33 PM   Specimen: Anterior Nasal Swab  Result Value Ref Range Status   SARS Coronavirus 2 by RT PCR NEGATIVE NEGATIVE Final    Comment: (NOTE) SARS-CoV-2 target nucleic acids are NOT DETECTED.  The SARS-CoV-2 RNA is generally detectable in upper respiratory specimens during the acute phase of infection. The lowest concentration of SARS-CoV-2 viral copies this assay can detect is 138 copies/mL. A negative result does not preclude SARS-Cov-2 infection and should not be used as the sole basis for treatment or other patient management decisions. A negative result may occur with  improper specimen collection/handling, submission of specimen other than nasopharyngeal swab, presence of viral mutation(s) within the areas targeted by this assay, and inadequate number of viral copies(<138 copies/mL). A negative result must be combined with clinical observations, patient history, and epidemiological information. The expected result is Negative.  Fact Sheet for Patients:  EntrepreneurPulse.com.au  Fact Sheet for Healthcare Providers:  IncredibleEmployment.be  This test is no t yet approved or cleared by the [place] FDA and  has been authorized for detection and/or diagnosis of SARS-CoV-2 by FDA under an Emergency Use Authorization (EUA). This EUA will remain  in effect (meaning this test can be used) for the duration of the COVID-19 declaration under Section 564(b)(1) of the Act, 21 U.S.C.section 360bbb-3(b)(1), unless the  authorization is terminated  or revoked sooner.       Influenza A by PCR NEGATIVE NEGATIVE Final   Influenza B by PCR NEGATIVE NEGATIVE Final    Comment: (NOTE) The Xpert Xpress SARS-CoV-2/FLU/RSV plus assay is intended as an aid in the diagnosis of influenza from Nasopharyngeal swab specimens and should not be used as a sole basis for treatment. Nasal washings and aspirates are unacceptable for Xpert Xpress SARS-CoV-2/FLU/RSV testing.  Fact Sheet for Patients: EntrepreneurPulse.com.au  Fact Sheet for Healthcare Providers: IncredibleEmployment.be  This test is not yet approved or cleared by the [place] FDA and has been authorized for detection and/or diagnosis of SARS-CoV-2 by FDA under an Emergency Use Authorization (EUA). This EUA will remain in effect (meaning this test can be used) for the duration of the COVID-19 declaration under Section 564(b)(1) of the Act, 21 U.S.C. section 360bbb-3(b)(1), unless the authorization is terminated or revoked.     Resp Syncytial Virus by PCR NEGATIVE NEGATIVE Final    Comment: (NOTE) Fact Sheet for Patients: EntrepreneurPulse.com.au  Fact Sheet for Healthcare Providers: IncredibleEmployment.be  This test is not yet approved or cleared by the [place] FDA and has been authorized for detection and/or diagnosis of SARS-CoV-2 by FDA under an Emergency Use Authorization (EUA). This EUA will remain in effect (meaning this test can be used) for the duration of the COVID-19 declaration under Section 564(b)(1) of the Act, 21 U.S.C. section 360bbb-3(b)(1), unless the authorization is terminated or revoked.  Performed at [facility], [address], [place], [place] [address]   Urine Culture (for pregnant, neutropenic or urologic patients or patients with an indwelling urinary catheter)     Status: Abnormal   Collection Time: 11/15/22  2:11 AM    Specimen: Urine, Clean Catch  Result Value Ref Range Status   Specimen Description   Final    URINE, CLEAN CATCH Performed at [facility], [address], [place], [place] [address]    Special  Requests   Final    NONE Performed at [facility], [address] [place], [place] [address]    Culture >=100,000 COLONIES/mL ESCHERICHIA COLI (A)  Final   Report Status 11/17/2022 FINAL  Final   Organism ID, Bacteria ESCHERICHIA COLI (A)  Final      Susceptibility   Escherichia coli - MIC*    AMPICILLIN >=32 RESISTANT Resistant     CEFAZOLIN <=4 SENSITIVE Sensitive     CEFEPIME <=0.12 SENSITIVE Sensitive     CEFTRIAXONE <=0.25 SENSITIVE Sensitive     CIPROFLOXACIN <=0.25 SENSITIVE Sensitive     GENTAMICIN <=1 SENSITIVE Sensitive     IMIPENEM <=0.25 SENSITIVE Sensitive     NITROFURANTOIN <=16 SENSITIVE Sensitive     TRIMETH/SULFA <=20 SENSITIVE Sensitive     AMPICILLIN/SULBACTAM >=32 RESISTANT Resistant     PIP/TAZO <=4 SENSITIVE Sensitive     * >=100,000 COLONIES/mL ESCHERICHIA COLI     Total time spend on discharging this patient, including the last patient exam, discussing the hospital stay, instructions for ongoing care as it relates to all pertinent caregivers, as well as preparing the medical discharge records, prescriptions, and/or referrals as applicable, is 30 minutes.    Enzo Bi, MD  [facility] 11/17/2022, 12:03 PM

## 2022-11-17 NOTE — Plan of Care (Signed)
Problem: Education: Goal: Ability to describe self-care measures that may prevent or decrease complications (Diabetes Survival Skills Education) will improve Outcome: Progressing Goal: Individualized Educational Video(s) Outcome: Progressing   Problem: Coping: Goal: Ability to adjust to condition or change in health will improve Outcome: Progressing   Problem: Fluid Volume: Goal: Ability to maintain a balanced intake and output will improve Outcome: Progressing   Problem: Health Behavior/Discharge Planning: Goal: Ability to identify and utilize available resources and services will improve Outcome: Progressing Goal: Ability to manage health-related needs will improve Outcome: Progressing   Problem: Metabolic: Goal: Ability to maintain appropriate glucose levels will improve Outcome: Progressing   Problem: Nutritional: Goal: Maintenance of adequate nutrition will improve Outcome: Progressing Goal: Progress toward achieving an optimal weight will improve Outcome: Progressing   Problem: Skin Integrity: Goal: Risk for impaired skin integrity will decrease Outcome: Progressing   Problem: Tissue Perfusion: Goal: Adequacy of tissue perfusion will improve Outcome: Progressing   Problem: Fluid Volume: Goal: Hemodynamic stability will improve Outcome: Progressing   Problem: Clinical Measurements: Goal: Diagnostic test results will improve Outcome: Progressing Goal: Signs and symptoms of infection will decrease Outcome: Progressing   Problem: Respiratory: Goal: Ability to maintain adequate ventilation will improve Outcome: Progressing   Problem: Education: Goal: Knowledge of General Education information will improve Description: Including pain rating scale, medication(s)/side effects and non-pharmacologic comfort measures Outcome: Progressing   Problem: Health Behavior/Discharge Planning: Goal: Ability to manage health-related needs will improve Outcome:  Progressing   Problem: Clinical Measurements: Goal: Ability to maintain clinical measurements within normal limits will improve Outcome: Progressing Goal: Will remain free from infection Outcome: Progressing Goal: Diagnostic test results will improve Outcome: Progressing Goal: Respiratory complications will improve Outcome: Progressing Goal: Cardiovascular complication will be avoided Outcome: Progressing   Problem: Activity: Goal: Risk for activity intolerance will decrease Outcome: Progressing   Problem: Nutrition: Goal: Adequate nutrition will be maintained Outcome: Progressing   Problem: Coping: Goal: Level of anxiety will decrease Outcome: Progressing   Problem: Elimination: Goal: Will not experience complications related to bowel motility Outcome: Progressing Goal: Will not experience complications related to urinary retention Outcome: Progressing   Problem: Pain Managment: Goal: General experience of comfort will improve Outcome: Progressing   Problem: Safety: Goal: Ability to remain free from injury will improve Outcome: Progressing   Problem: Skin Integrity: Goal: Risk for impaired skin integrity will decrease Outcome: Progressing   Problem: Education: Goal: Ability to describe self-care measures that may prevent or decrease complications (Diabetes Survival Skills Education) will improve 11/17/2022 1251 by Verl Dicker, RN Outcome: Adequate for Discharge 11/17/2022 0958 by Verl Dicker, RN Outcome: Progressing Goal: Individualized Educational Video(s) 11/17/2022 1251 by Verl Dicker, RN Outcome: Adequate for Discharge 11/17/2022 0958 by Verl Dicker, RN Outcome: Progressing   Problem: Coping: Goal: Ability to adjust to condition or change in health will improve 11/17/2022 1251 by Verl Dicker, RN Outcome: Adequate for Discharge 11/17/2022 0958 by Verl Dicker, RN Outcome: Progressing   Problem: Fluid Volume: Goal: Ability to  maintain a balanced intake and output will improve 11/17/2022 1251 by Verl Dicker, RN Outcome: Adequate for Discharge 11/17/2022 0958 by Verl Dicker, RN Outcome: Progressing   Problem: Health Behavior/Discharge Planning: Goal: Ability to identify and utilize available resources and services will improve 11/17/2022 1251 by Verl Dicker, RN Outcome: Adequate for Discharge 11/17/2022 0958 by Verl Dicker, RN Outcome: Progressing Goal: Ability to manage health-related needs will improve 11/17/2022 1251 by Verl Dicker,  RN Outcome: Adequate for Discharge 11/17/2022 0958 by Verl Dicker, RN Outcome: Progressing   Problem: Metabolic: Goal: Ability to maintain appropriate glucose levels will improve 11/17/2022 1251 by Verl Dicker, RN Outcome: Adequate for Discharge 11/17/2022 0958 by Verl Dicker, RN Outcome: Progressing   Problem: Nutritional: Goal: Maintenance of adequate nutrition will improve 11/17/2022 1251 by Verl Dicker, RN Outcome: Adequate for Discharge 11/17/2022 0958 by Verl Dicker, RN Outcome: Progressing Goal: Progress toward achieving an optimal weight will improve 11/17/2022 1251 by Verl Dicker, RN Outcome: Adequate for Discharge 11/17/2022 0958 by Verl Dicker, RN Outcome: Progressing   Problem: Skin Integrity: Goal: Risk for impaired skin integrity will decrease 11/17/2022 1251 by Verl Dicker, RN Outcome: Adequate for Discharge 11/17/2022 0958 by Verl Dicker, RN Outcome: Progressing   Problem: Tissue Perfusion: Goal: Adequacy of tissue perfusion will improve 11/17/2022 1251 by Verl Dicker, RN Outcome: Adequate for Discharge 11/17/2022 0958 by Verl Dicker, RN Outcome: Progressing   Problem: Fluid Volume: Goal: Hemodynamic stability will improve 11/17/2022 1251 by Verl Dicker, RN Outcome: Adequate for Discharge 11/17/2022 0958 by Verl Dicker, RN Outcome: Progressing   Problem: Clinical  Measurements: Goal: Diagnostic test results will improve 11/17/2022 1251 by Verl Dicker, RN Outcome: Adequate for Discharge 11/17/2022 0958 by Verl Dicker, RN Outcome: Progressing Goal: Signs and symptoms of infection will decrease 11/17/2022 1251 by Verl Dicker, RN Outcome: Adequate for Discharge 11/17/2022 0958 by Verl Dicker, RN Outcome: Progressing   Problem: Respiratory: Goal: Ability to maintain adequate ventilation will improve 11/17/2022 1251 by Verl Dicker, RN Outcome: Adequate for Discharge 11/17/2022 0958 by Verl Dicker, RN Outcome: Progressing   Problem: Education: Goal: Knowledge of General Education information will improve Description: Including pain rating scale, medication(s)/side effects and non-pharmacologic comfort measures 11/17/2022 1251 by Verl Dicker, RN Outcome: Adequate for Discharge 11/17/2022 0958 by Verl Dicker, RN Outcome: Progressing   Problem: Health Behavior/Discharge Planning: Goal: Ability to manage health-related needs will improve 11/17/2022 1251 by Verl Dicker, RN Outcome: Adequate for Discharge 11/17/2022 0958 by Verl Dicker, RN Outcome: Progressing   Problem: Clinical Measurements: Goal: Ability to maintain clinical measurements within normal limits will improve 11/17/2022 1251 by Verl Dicker, RN Outcome: Adequate for Discharge 11/17/2022 0958 by Verl Dicker, RN Outcome: Progressing Goal: Will remain free from infection 11/17/2022 1251 by Verl Dicker, RN Outcome: Adequate for Discharge 11/17/2022 0958 by Verl Dicker, RN Outcome: Progressing Goal: Diagnostic test results will improve 11/17/2022 1251 by Verl Dicker, RN Outcome: Adequate for Discharge 11/17/2022 0958 by Verl Dicker, RN Outcome: Progressing Goal: Respiratory complications will improve 11/17/2022 1251 by Verl Dicker, RN Outcome: Adequate for Discharge 11/17/2022 0958 by Verl Dicker,  RN Outcome: Progressing Goal: Cardiovascular complication will be avoided 11/17/2022 1251 by Verl Dicker, RN Outcome: Adequate for Discharge 11/17/2022 0958 by Verl Dicker, RN Outcome: Progressing   Problem: Activity: Goal: Risk for activity intolerance will decrease 11/17/2022 1251 by Verl Dicker, RN Outcome: Adequate for Discharge 11/17/2022 [phone] by Verl Dicker, RN Outcome: Progressing   Problem: Nutrition: Goal: Adequate nutrition will be maintained 11/17/2022 1251 by Verl Dicker, RN Outcome: Adequate for Discharge 11/17/2022 0958 by Verl Dicker, RN Outcome: Progressing   Problem: Coping: Goal: Level of anxiety will decrease 11/17/2022 1251 by Verl Dicker, RN Outcome: Adequate for Discharge 11/17/2022 0958 by Verl Dicker, RN  Outcome: Progressing   Problem: Elimination: Goal: Will not experience complications related to bowel motility 11/17/2022 1251 by Verl Dicker, RN Outcome: Adequate for Discharge 11/17/2022 0958 by Verl Dicker, RN Outcome: Progressing Goal: Will not experience complications related to urinary retention 11/17/2022 1251 by Verl Dicker, RN Outcome: Adequate for Discharge 11/17/2022 0958 by Verl Dicker, RN Outcome: Progressing   Problem: Pain Managment: Goal: General experience of comfort will improve 11/17/2022 1251 by Verl Dicker, RN Outcome: Adequate for Discharge 11/17/2022 0958 by Verl Dicker, RN Outcome: Progressing   Problem: Safety: Goal: Ability to remain free from injury will improve 11/17/2022 1251 by Verl Dicker, RN Outcome: Adequate for Discharge 11/17/2022 0958 by Verl Dicker, RN Outcome: Progressing   Problem: Skin Integrity: Goal: Risk for impaired skin integrity will decrease 11/17/2022 1251 by Verl Dicker, RN Outcome: Adequate for Discharge 11/17/2022 0958 by Verl Dicker, RN Outcome: Progressing

## 2022-11-17 NOTE — Plan of Care (Signed)

## 2022-12-07 ENCOUNTER — Other Ambulatory Visit: Payer: Self-pay | Admitting: Family Medicine

## 2022-12-07 DIAGNOSIS — E278 Other specified disorders of adrenal gland: Secondary | ICD-10-CM

## 2022-12-25 ENCOUNTER — Ambulatory Visit
Admission: RE | Admit: 2022-12-25 | Discharge: 2022-12-25 | Disposition: A | Discharge status: Home / Self Care | Payer: Medicare PPO | Source: Ambulatory Visit | Attending: Family Medicine | Admitting: Family Medicine

## 2022-12-25 DIAGNOSIS — E278 Other specified disorders of adrenal gland: Secondary | ICD-10-CM

## 2022-12-25 MED ORDER — IOPAMIDOL (ISOVUE-300) INJECTION 61%
100.0000 mL | Freq: Once | INTRAVENOUS | Status: AC | PRN
  Administered 2022-12-25: 100 mL via INTRAVENOUS

## 2023-02-04 ENCOUNTER — Ambulatory Visit
Admission: RE | Admit: 2023-02-04 | Discharge: 2023-02-04 | Disposition: A | Discharge status: Home / Self Care | Payer: Medicare PPO | Source: Ambulatory Visit | Attending: Family Medicine | Admitting: Family Medicine

## 2023-02-04 ENCOUNTER — Other Ambulatory Visit: Payer: Self-pay | Admitting: Family Medicine

## 2023-02-04 DIAGNOSIS — J209 Acute bronchitis, unspecified: Secondary | ICD-10-CM

## 2023-10-22 ENCOUNTER — Other Ambulatory Visit (INDEPENDENT_AMBULATORY_CARE_PROVIDER_SITE_OTHER): Payer: Self-pay | Admitting: Nurse Practitioner

## 2023-10-22 DIAGNOSIS — I739 Peripheral vascular disease, unspecified: Secondary | ICD-10-CM

## 2023-10-25 ENCOUNTER — Ambulatory Visit (INDEPENDENT_AMBULATORY_CARE_PROVIDER_SITE_OTHER): Payer: Medicare HMO

## 2023-10-25 ENCOUNTER — Ambulatory Visit (INDEPENDENT_AMBULATORY_CARE_PROVIDER_SITE_OTHER): Payer: Medicare HMO | Admitting: Vascular Surgery

## 2023-10-25 ENCOUNTER — Encounter (INDEPENDENT_AMBULATORY_CARE_PROVIDER_SITE_OTHER): Payer: Self-pay | Admitting: Vascular Surgery

## 2023-10-25 VITALS — BP 149/80 | HR 81 | Resp 16 | Wt 133.6 lb

## 2023-10-25 DIAGNOSIS — I739 Peripheral vascular disease, unspecified: Secondary | ICD-10-CM

## 2023-10-25 DIAGNOSIS — E1165 Type 2 diabetes mellitus with hyperglycemia: Secondary | ICD-10-CM

## 2023-10-25 DIAGNOSIS — I10 Essential (primary) hypertension: Secondary | ICD-10-CM

## 2023-10-25 NOTE — Assessment & Plan Note (Signed)
 blood glucose control important in reducing the progression of atherosclerotic disease. Also, involved in wound healing. On appropriate medications.

## 2023-10-25 NOTE — Assessment & Plan Note (Signed)
 Her ABIs today are 0.98 on the right and 0.70 on the left with multiphasic waveforms on the right and monophasic waveforms on the left. No lifestyle limiting or limb threatening symptoms at this time.  Has some mild claudication but this is really not bothersome to her.  Does not desire any intervention and at this point I would not recommend any.  I would continue aspirin  daily and her statin agent.  We will follow her up in 1 year with noninvasive studies.  She will contact our office with worsening symptoms in the interim.

## 2023-10-25 NOTE — Progress Notes (Signed)
 MRN : [id number]  Gail Carroll is a 78 y.o. (Mar 25, 1946) female who presents with chief complaint of  Chief Complaint  Patient presents with   Follow-up    61yr abi follow up  .  History of Present Illness: Patient returns today in follow up of her PAD.  She says she is doing well with minimal leg pain with activity at this point.  She walks a lot and remains vigorous and active.  She denies any open wounds or infection.  No leg pain that wakes her from sleep.  Her ABIs today are 0.98 on the right and 0.70 on the left with multiphasic waveforms on the right and monophasic waveforms on the left.  Current Outpatient Medications  Medication Sig Dispense Refill   acetaminophen  (TYLENOL ) 325 MG tablet Take 650 mg by mouth every morning.     amLODipine  (NORVASC ) 10 MG tablet Hold until outpatient followup due to low normal blood pressure.     aspirin  81 MG tablet Take 81 mg by mouth daily.     Cholecalciferol (D3) 50 MCG (2000 UT) TABS Take by mouth.     Choline Fenofibrate (FENOFIBRIC ACID PO) Take 45 mg by mouth daily at 6 (six) AM.     diphenhydramine-acetaminophen  (TYLENOL  PM) 25-500 MG TABS tablet Take 1 tablet by mouth at bedtime as needed.     fenofibrate 54 MG tablet Take 54 mg by mouth daily.     linagliptin (TRADJENTA) 5 MG TABS tablet Take 5 mg by mouth daily.     lovastatin (MEVACOR) 40 MG tablet Take by mouth every morning.     hydrochlorothiazide  (MICROZIDE ) 12.5 MG capsule Hold until outpatient followup due to low normal blood pressure. (Patient not taking: Reported on 10/25/2023)     losartan  (COZAAR ) 50 MG tablet Hold until outpatient followup due to low normal blood pressure. (Patient not taking: Reported on 10/25/2023)     omeprazole (PRILOSEC) 40 MG capsule Take 40 mg by mouth daily. (Patient not taking: Reported on 10/25/2023)     OVER THE COUNTER MEDICATION [employer] cold and allergy (Patient not taking: Reported on 10/25/2023)     No current facility-administered  medications for this visit.    Past Medical History:  Diagnosis Date   GERD (gastroesophageal reflux disease)    Hypertension    Pre-diabetes     Past Surgical History:  Procedure Laterality Date   BACK SURGERY     c5-6   COLONOSCOPY N/A 03/02/2022   Procedure: COLONOSCOPY;  Surgeon: Onita Elspeth Sharper, DO;  Location: [facility] ENDOSCOPY;  Service: Gastroenterology;  Laterality: N/A;   DILATATION & CURETTAGE/HYSTEROSCOPY WITH MYOSURE N/A 12/27/2015   Procedure: DILATATION & CURETTAGE/HYSTEROSCOPY WITH MYOSURE/POLYPECTOMY;  Surgeon: Garnette JONETTA Mace, MD;  Location: [facility] ORS;  Service: Gynecology;  Laterality: N/A;   TUBAL LIGATION       Social History   Tobacco Use   Smoking status: Former    Types: Cigarettes   Smokeless tobacco: Never  Vaping Use   Vaping status: Never Used  Substance Use Topics   Alcohol use: No   Drug use: Never       No family history on file.   Allergies  Allergen Reactions   Metformin     Other Reaction(s): severe gi  upset ; decreased energy and weight loss   Tea Other (See Comments)    Throat swells   Valium [Diazepam] Other (See Comments)    Very sensitive, small dose makes her incoherent  REVIEW OF SYSTEMS (Negative unless checked)  Constitutional: [] Weight loss  [] Fever  [] Chills Cardiac: [] Chest pain   [] Chest pressure   [] Palpitations   [] Shortness of breath when laying flat   [] Shortness of breath at rest   [] Shortness of breath with exertion. Vascular:  [] Pain in legs with walking   [] Pain in legs at rest   [] Pain in legs when laying flat   [] Claudication   [] Pain in feet when walking  [] Pain in feet at rest  [] Pain in feet when laying flat   [] History of DVT   [] Phlebitis   [] Swelling in legs   [] Varicose veins   [] Non-healing ulcers Pulmonary:   [] Uses home oxygen   [] Productive cough   [] Hemoptysis   [] Wheeze  [] COPD   [] Asthma Neurologic:  [] Dizziness  [] Blackouts   [] Seizures   [] History of stroke   [] History of TIA   [] Aphasia   [] Temporary blindness   [] Dysphagia   [] Weakness or numbness in arms   [] Weakness or numbness in legs Musculoskeletal:  [] Arthritis   [] Joint swelling   [x] Joint pain   [] Low back pain Hematologic:  [] Easy bruising  [] Easy bleeding   [] Hypercoagulable state   [] Anemic   Gastrointestinal:  [] Blood in stool   [] Vomiting blood  [x] Gastroesophageal reflux/heartburn   [] Abdominal pain Genitourinary:  [] Chronic kidney disease   [] Difficult urination  [] Frequent urination  [] Burning with urination   [] Hematuria Skin:  [] Rashes   [] Ulcers   [] Wounds Psychological:  [] History of anxiety   []  History of major depression.  Physical Examination  BP (!) 149/80   Pulse 81   Resp 16   Wt 133 lb 9.6 oz (60.6 kg)   BMI 20.92 kg/m  Gen:  WD/WN, NAD.  Appears younger than stated age Head: English/AT, No temporalis wasting. Ear/Nose/Throat: Hearing grossly intact, nares w/o erythema or drainage Eyes: Conjunctiva clear. Sclera non-icteric Neck: Supple.  Trachea midline Pulmonary:  Good air movement, no use of accessory muscles.  Cardiac: RRR, no JVD Vascular:  Vessel Right Left  Radial Palpable Palpable                          PT 2+ palpable 1+ palpable  DP 1+ palpable 1+ palpable   Gastrointestinal: soft, non-tender/non-distended. No guarding/reflex.  Musculoskeletal: M/S 5/5 throughout.  No deformity or atrophy.  No edema. Neurologic: Sensation grossly intact in extremities.  Symmetrical.  Speech is fluent.  Psychiatric: Judgment intact, Mood & affect appropriate for pt's clinical situation. Dermatologic: No rashes or ulcers noted.  No cellulitis or open wounds.      Labs No results found for this or any previous visit (from the past 2160 hours).  Radiology No results found.  Assessment/Plan  PAD (peripheral artery disease) (HCC) Her ABIs today are 0.98 on the right and 0.70 on the left with multiphasic waveforms on the right and monophasic waveforms on the left. No  lifestyle limiting or limb threatening symptoms at this time.  Has some mild claudication but this is really not bothersome to her.  Does not desire any intervention and at this point I would not recommend any.  I would continue aspirin  daily and her statin agent.  We will follow her up in 1 year with noninvasive studies.  She will contact our office with worsening symptoms in the interim.  Hypertension blood pressure control important in reducing the progression of atherosclerotic disease. On appropriate oral medications.   Uncontrolled type 2 diabetes mellitus with hyperglycemia,  without long-term current use of insulin  (HCC) blood glucose control important in reducing the progression of atherosclerotic disease. Also, involved in wound healing. On appropriate medications.    Selinda Gu, MD  10/25/2023 2:19 PM    This note was created with Dragon medical transcription system.  Any errors from dictation are purely unintentional

## 2023-10-25 NOTE — Assessment & Plan Note (Signed)
 blood pressure control important in reducing the progression of atherosclerotic disease. On appropriate oral medications.

## 2023-10-29 LAB — VAS US ABI WITH/WO TBI
Left ABI: 0.7
Right ABI: 0.98

## 2024-10-23 ENCOUNTER — Other Ambulatory Visit (INDEPENDENT_AMBULATORY_CARE_PROVIDER_SITE_OTHER): Payer: Medicare HMO

## 2024-10-23 ENCOUNTER — Ambulatory Visit (INDEPENDENT_AMBULATORY_CARE_PROVIDER_SITE_OTHER): Payer: Medicare HMO | Admitting: Vascular Surgery

## 2024-10-23 DIAGNOSIS — I739 Peripheral vascular disease, unspecified: Secondary | ICD-10-CM

## 2024-11-03 ENCOUNTER — Ambulatory Visit (INDEPENDENT_AMBULATORY_CARE_PROVIDER_SITE_OTHER): Admitting: Vascular Surgery
